# Patient Record
Sex: Female | Born: 1985
Health system: Southern US, Community
[De-identification: ages and names within clinical notes are randomized; demographics above are authoritative.]

## PROBLEM LIST (undated history)

## (undated) DIAGNOSIS — Z8041 Family history of malignant neoplasm of ovary: Secondary | ICD-10-CM

## (undated) DIAGNOSIS — R102 Pelvic and perineal pain: Secondary | ICD-10-CM

## (undated) DIAGNOSIS — Z800 Family history of malignant neoplasm of digestive organs: Secondary | ICD-10-CM

## (undated) DIAGNOSIS — Z803 Family history of malignant neoplasm of breast: Secondary | ICD-10-CM

## (undated) HISTORY — DX: Pelvic and perineal pain: R10.2

## (undated) HISTORY — PX: CHOLECYSTECTOMY: SHX55

## (undated) HISTORY — DX: Family history of malignant neoplasm of breast: Z80.3

## (undated) HISTORY — DX: Family history of malignant neoplasm of digestive organs: Z80.0

## (undated) HISTORY — DX: Family history of malignant neoplasm of ovary: Z80.41

---
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
## 2005-04-27 ENCOUNTER — Ambulatory Visit: Payer: Self-pay | Admitting: Family Medicine

## 2005-04-29 ENCOUNTER — Ambulatory Visit: Payer: Self-pay | Admitting: Family Medicine

## 2005-06-10 ENCOUNTER — Ambulatory Visit: Payer: Self-pay | Admitting: Family Medicine

## 2005-06-14 ENCOUNTER — Ambulatory Visit: Payer: Self-pay | Admitting: Family Medicine

## 2005-12-26 ENCOUNTER — Ambulatory Visit: Payer: Self-pay | Admitting: Family Medicine

## 2005-12-27 ENCOUNTER — Ambulatory Visit: Payer: Self-pay | Admitting: Family Medicine

## 2006-02-06 ENCOUNTER — Ambulatory Visit: Payer: Self-pay | Admitting: Family Medicine

## 2006-07-05 ENCOUNTER — Ambulatory Visit (HOSPITAL_BASED_OUTPATIENT_CLINIC_OR_DEPARTMENT_OTHER): Admission: RE | Admit: 2006-07-05 | Discharge: 2006-07-05 | Payer: Self-pay | Admitting: Plastic Surgery

## 2006-07-05 ENCOUNTER — Encounter (INDEPENDENT_AMBULATORY_CARE_PROVIDER_SITE_OTHER): Payer: Self-pay | Admitting: *Deleted

## 2007-04-26 ENCOUNTER — Ambulatory Visit: Payer: Self-pay | Admitting: Family Medicine

## 2007-05-02 ENCOUNTER — Ambulatory Visit: Payer: Self-pay | Admitting: General Surgery

## 2007-05-08 ENCOUNTER — Encounter (INDEPENDENT_AMBULATORY_CARE_PROVIDER_SITE_OTHER): Payer: Self-pay | Admitting: Internal Medicine

## 2007-05-08 ENCOUNTER — Ambulatory Visit: Payer: Self-pay | Admitting: General Surgery

## 2007-07-30 ENCOUNTER — Ambulatory Visit: Payer: Self-pay | Admitting: Family Medicine

## 2007-07-30 DIAGNOSIS — K7689 Other specified diseases of liver: Secondary | ICD-10-CM

## 2007-07-31 LAB — CONVERTED CEMR LAB
ALT: 17 units/L (ref 0–35)
AST: 17 units/L (ref 0–37)
Albumin: 4.1 g/dL (ref 3.5–5.2)
Alkaline Phosphatase: 50 units/L (ref 39–117)
BUN: 10 mg/dL (ref 6–23)
Basophils Absolute: 0 10*3/uL (ref 0.0–0.1)
Basophils Relative: 0.4 % (ref 0.0–1.0)
CO2: 30 meq/L (ref 19–32)
Calcium: 9.4 mg/dL (ref 8.4–10.5)
Chloride: 109 meq/L (ref 96–112)
Creatinine, Ser: 1 mg/dL (ref 0.4–1.2)
HDL: 52 mg/dL (ref >39.0)
Hemoglobin: 13.6 g/dL (ref 12.0–15.0)
LDL Cholesterol: 79 mg/dL (ref 0–99)
MCHC: 34.7 g/dL (ref 30.0–36.0)
Monocytes Absolute: 0.5 10*3/uL (ref 0.2–0.7)
Monocytes Relative: 6.5 % (ref 3.0–11.0)
Potassium: 4.5 meq/L (ref 3.5–5.1)
RBC: 4.43 M/uL (ref 3.87–5.11)
RDW: 12.2 % (ref 11.5–14.6)
Total Bilirubin: 0.7 mg/dL (ref 0.3–1.2)
Total CHOL/HDL Ratio: 2.9
Total Protein: 6.8 g/dL (ref 6.0–8.3)
VLDL: 21 mg/dL (ref 0–40)

## 2007-09-07 ENCOUNTER — Ambulatory Visit: Payer: Self-pay | Admitting: Family Medicine

## 2007-09-07 ENCOUNTER — Telehealth (INDEPENDENT_AMBULATORY_CARE_PROVIDER_SITE_OTHER): Payer: Self-pay | Admitting: *Deleted

## 2007-09-07 DIAGNOSIS — R82998 Other abnormal findings in urine: Secondary | ICD-10-CM

## 2007-09-07 LAB — CONVERTED CEMR LAB
Bilirubin Urine: NEGATIVE
Glucose, Urine, Semiquant: NEGATIVE
Nitrite: NEGATIVE
Protein, U semiquant: 30
Specific Gravity, Urine: >=1.03
pH: 6

## 2007-09-08 ENCOUNTER — Encounter: Payer: Self-pay | Admitting: Family Medicine

## 2007-10-08 ENCOUNTER — Ambulatory Visit: Payer: Self-pay | Admitting: Family Medicine

## 2007-11-26 ENCOUNTER — Ambulatory Visit: Payer: Self-pay | Admitting: Internal Medicine

## 2008-02-15 ENCOUNTER — Telehealth: Payer: Self-pay | Admitting: Family Medicine

## 2008-03-11 ENCOUNTER — Ambulatory Visit: Payer: Self-pay | Admitting: Internal Medicine

## 2010-06-11 ENCOUNTER — Ambulatory Visit: Payer: Self-pay | Admitting: Obstetrics & Gynecology

## 2010-06-18 ENCOUNTER — Ambulatory Visit: Payer: Self-pay | Admitting: Obstetrics & Gynecology

## 2010-06-18 ENCOUNTER — Inpatient Hospital Stay (HOSPITAL_COMMUNITY): Admission: AD | Admit: 2010-06-18 | Discharge: 2010-06-21 | Payer: Self-pay | Admitting: Obstetrics and Gynecology

## 2010-06-25 ENCOUNTER — Ambulatory Visit: Payer: Self-pay | Admitting: Obstetrics & Gynecology

## 2010-07-02 ENCOUNTER — Ambulatory Visit: Payer: Self-pay | Admitting: Obstetrics & Gynecology

## 2010-07-06 ENCOUNTER — Inpatient Hospital Stay (HOSPITAL_COMMUNITY): Admission: AD | Admit: 2010-07-06 | Discharge: 2010-07-06 | Payer: Self-pay | Admitting: Obstetrics and Gynecology

## 2010-07-09 ENCOUNTER — Encounter (HOSPITAL_COMMUNITY): Payer: Self-pay | Admitting: Obstetrics and Gynecology

## 2010-07-09 ENCOUNTER — Inpatient Hospital Stay (HOSPITAL_COMMUNITY): Admission: AD | Admit: 2010-07-09 | Discharge: 2010-07-11 | Payer: Self-pay | Admitting: Obstetrics and Gynecology

## 2010-07-11 ENCOUNTER — Encounter: Admission: RE | Admit: 2010-07-11 | Discharge: 2010-07-12 | Payer: Self-pay | Admitting: Obstetrics and Gynecology

## 2010-12-30 LAB — CBC
HCT: 37.7 % (ref 36.0–46.0)
Hemoglobin: 9.5 g/dL — ABNORMAL LOW (ref 12.0–15.0)
MCH: 30.4 pg (ref 26.0–34.0)
MCH: 31.4 pg (ref 26.0–34.0)
MCHC: 35.3 g/dL (ref 30.0–36.0)
MCV: 88.9 fL (ref 78.0–100.0)
MCV: 89.2 fL (ref 78.0–100.0)
Platelets: 124 10*3/uL — ABNORMAL LOW (ref 150–400)
Platelets: 155 10*3/uL (ref 150–400)
RBC: 3.02 MIL/uL — ABNORMAL LOW (ref 3.87–5.11)
RDW: 14.6 % (ref 11.5–15.5)
WBC: 11.3 10*3/uL — ABNORMAL HIGH (ref 4.0–10.5)

## 2011-03-04 NOTE — Op Note (Signed)
Natalie Walters, FRIESENHAHN            ACCOUNT NO.:  [id number]   MEDICAL RECORD NO.:  [id number]          PATIENT TYPE:  AMB   LOCATION:  DSC                          FACILITY:  [facility]   PHYSICIAN:  Alfredia Ferguson, M.D.  DATE OF BIRTH:  September 12, 1986   DATE OF PROCEDURE:  DATE OF DISCHARGE:                                 OPERATIVE REPORT   PREOPERATIVE DIAGNOSIS:  6-cm lipoma, left upper back and paraspinal area.   POSTOPERATIVE DIAGNOSIS:  6-cm lipoma, left upper back and paraspinal area.   OPERATION PERFORMED:  Excision of lipoma, left upper back.   SURGEON:  Dr. Benna Dunks.   ANESTHESIA:  2% Xylocaine, 1:100,000 epinephrine.   INDICATION FOR SURGERY:  This is a 25 year old female with a slowly  enlarging soft tissue mass in the left upper back.  The patient wishes to  have this area removed.  She believes that it has progressively been getting  larger.  She understands the risk of recurrence of the lipoma.  She  understands that she will have permanent, potentially unsightly, scar on the  back.  In spite of that, the patient wishes to proceed with the surgery.   DESCRIPTION OF SURGERY:  The patient was placed in a prone position, and  skin marks were placed overlying the lipoma.  Local anesthesia was  infiltrated, and the left upper back was prepped and draped with sterile  fashion.  An incision was made approximately 3-4 cm in length directly over  the lipoma.  The incision was deepened until reaching the capsule overlying  the lipoma.  This capsule was opened, and the lipoma was visualized.  The  lipoma was dissected out of its anatomic bed using blunt and electrocautery  dissection.  It was lying on top of the fascia of one of the paraspinous  muscles.  The lipoma was removed in its entirety without difficulty.  The  specimen was submitted for pathology.  Hemostasis was accomplished using  pressure and electrocautery.  The wound was closed by approximating the deep  fascia using  interrupted 3-0 Monocryl suture.  The dermis was closed in a  similar fashion.  A running subcuticular 3-0 Monocryl was placed.  Estimated  blood loss was minimal.  The area was cleansed and dried, and Steri-Strips  were applied followed by a dressing.  The patient was discharged to home in  the care of her parents.      Alfredia Ferguson, M.D.  Electronically Signed     WBB/MEDQ  D:  07/05/2006  T:  07/05/2006  Job:  [id number]

## 2014-08-20 ENCOUNTER — Other Ambulatory Visit: Payer: Self-pay | Admitting: Obstetrics and Gynecology

## 2014-08-21 LAB — CYTOLOGY - PAP

## 2015-03-29 ENCOUNTER — Encounter: Payer: Self-pay | Admitting: *Deleted

## 2015-03-30 ENCOUNTER — Ambulatory Visit (INDEPENDENT_AMBULATORY_CARE_PROVIDER_SITE_OTHER): Payer: 59 | Admitting: *Deleted

## 2015-03-30 VITALS — BP 125/70 | HR 69 | Ht 70.0 in | Wt 200.9 lb

## 2015-03-30 DIAGNOSIS — E669 Obesity, unspecified: Secondary | ICD-10-CM

## 2015-03-30 MED ORDER — CYANOCOBALAMIN 1000 MCG/ML IJ SOLN
1000.0000 ug | Freq: Once | INTRAMUSCULAR | Status: AC
  Administered 2015-03-30: 1000 ug via INTRAMUSCULAR

## 2015-04-24 ENCOUNTER — Institutional Professional Consult (permissible substitution): Payer: Self-pay | Admitting: Obstetrics and Gynecology

## 2015-05-01 ENCOUNTER — Other Ambulatory Visit: Payer: 59

## 2015-05-01 ENCOUNTER — Other Ambulatory Visit: Payer: Self-pay

## 2015-05-01 ENCOUNTER — Ambulatory Visit (INDEPENDENT_AMBULATORY_CARE_PROVIDER_SITE_OTHER): Payer: 59 | Admitting: Obstetrics and Gynecology

## 2015-05-01 ENCOUNTER — Other Ambulatory Visit: Payer: Self-pay | Admitting: Obstetrics and Gynecology

## 2015-05-01 ENCOUNTER — Encounter: Payer: Self-pay | Admitting: Obstetrics and Gynecology

## 2015-05-01 VITALS — BP 109/73 | HR 65 | Ht 70.0 in | Wt 196.2 lb

## 2015-05-01 DIAGNOSIS — N8320 Unspecified ovarian cysts: Secondary | ICD-10-CM | POA: Diagnosis not present

## 2015-05-01 DIAGNOSIS — R102 Pelvic and perineal pain unspecified side: Secondary | ICD-10-CM

## 2015-05-01 DIAGNOSIS — N83209 Unspecified ovarian cyst, unspecified side: Secondary | ICD-10-CM

## 2015-05-01 DIAGNOSIS — E663 Overweight: Secondary | ICD-10-CM | POA: Diagnosis not present

## 2015-05-01 MED ORDER — CYANOCOBALAMIN 1000 MCG/ML IJ SOLN
1000.0000 ug | Freq: Once | INTRAMUSCULAR | Status: AC
  Administered 2015-05-01: 1000 ug via INTRAMUSCULAR

## 2015-05-01 NOTE — Progress Notes (Signed)
Patient ID: Erby PianStephanie M Walters, female   DOB: 07/31/1986, 29 y.o.   MRN: [id number]  Here for f/u scan for previous ovarian cyst:  Indications:History of rt ovarian cyst Findings:  The uterus measures 8.1 x 5.0 x 3.7 cm.. Echo texture is homogenous without evidence of focal masses.  The Endometrium measures 1.8 mm. IUD seen in normal location within endometrium.  Right Ovary measures 5.3 x 2.1 x 2.0  cm. It is normal in appearance. Left Ovary measures 3.2 x 1.8 x 2.4  cm. It is normal appearance. Survey of the adnexa demonstrates no adnexal masses. There is no free fluid in the cul de sac.  Impression: 1. Normal appearing pelvic ultrasound. No evidence of ovarian cyst on today's exam. IUD appears in normal location within endometrium.    Reviewed findings with patient; Mirena due to be replaced in Oct- will schedule appointment.  Irean Kendricks Burr,CNM

## 2015-05-15 ENCOUNTER — Encounter: Payer: Self-pay | Admitting: Emergency Medicine

## 2015-05-15 ENCOUNTER — Ambulatory Visit
Admission: EM | Admit: 2015-05-15 | Discharge: 2015-05-15 | Disposition: A | Discharge status: Home / Self Care | Payer: 59 | Attending: Internal Medicine | Admitting: Internal Medicine

## 2015-05-15 DIAGNOSIS — Z79899 Other long term (current) drug therapy: Secondary | ICD-10-CM | POA: Diagnosis not present

## 2015-05-15 DIAGNOSIS — R319 Hematuria, unspecified: Secondary | ICD-10-CM | POA: Insufficient documentation

## 2015-05-15 DIAGNOSIS — N390 Urinary tract infection, site not specified: Secondary | ICD-10-CM | POA: Diagnosis present

## 2015-05-15 DIAGNOSIS — R300 Dysuria: Secondary | ICD-10-CM | POA: Diagnosis not present

## 2015-05-15 LAB — URINALYSIS COMPLETE WITH MICROSCOPIC (ARMC ONLY)
Bacteria, UA: NONE SEEN — AB
Glucose, UA: NEGATIVE mg/dL
Nitrite: NEGATIVE
PH: 6 (ref 5.0–8.0)
Protein, ur: >300 mg/dL — AB
SPECIFIC GRAVITY, URINE: 1.02 (ref 1.005–1.030)

## 2015-05-15 MED ORDER — PHENAZOPYRIDINE HCL 200 MG PO TABS: 200.0000 mg | ORAL_TABLET | Freq: Three times a day (TID) | ORAL | Status: DC

## 2015-05-15 MED ORDER — NITROFURANTOIN MONOHYD MACRO 100 MG PO CAPS: 100.0000 mg | ORAL_CAPSULE | Freq: Two times a day (BID) | ORAL | Status: DC

## 2015-05-15 NOTE — Discharge Instructions (Signed)
Hematuria Hematuria is blood in your urine. It can be caused by a bladder infection, kidney infection, prostate infection, kidney stone, or cancer of your urinary tract. Infections can usually be treated with medicine, and a kidney stone usually will pass through your urine. If neither of these is the cause of your hematuria, further workup to find out the reason may be needed. It is very important that you tell your health care provider about any blood you see in your urine, even if the blood stops without treatment or happens without causing pain. Blood in your urine that happens and then stops and then happens again can be a symptom of a very serious condition. Also, pain is not a symptom in the initial stages of many urinary cancers. HOME CARE INSTRUCTIONS   Drink lots of fluid, 3-4 quarts a day. If you have been diagnosed with an infection, cranberry juice is especially recommended, in addition to large amounts of water.  Avoid caffeine, tea, and carbonated beverages because they tend to irritate the bladder.  Avoid alcohol because it may irritate the prostate.  Take all medicines as directed by your health care provider.  If you were prescribed an antibiotic medicine, finish it all even if you start to feel better.  If you have been diagnosed with a kidney stone, follow your health care provider's instructions regarding straining your urine to catch the stone.  Empty your bladder often. Avoid holding urine for long periods of time.  After a bowel movement, women should cleanse front to back. Use each tissue only once.  Empty your bladder before and after sexual intercourse if you are a female. SEEK MEDICAL CARE IF:  You develop back pain.  You have a fever.  You have a feeling of sickness in your stomach (nausea) or vomiting.  Your symptoms are not better in 3 days. Return sooner if you are getting worse. SEEK IMMEDIATE MEDICAL CARE IF:   You develop severe vomiting and are  unable to keep the medicine down.  You develop severe back or abdominal pain despite taking your medicines.  You begin passing a large amount of blood or clots in your urine.  You feel extremely weak or faint, or you pass out. MAKE SURE YOU:   Understand these instructions.  Will watch your condition.  Will get help right away if you are not doing well or get worse. Document Released: 10/03/2005 Document Revised: 02/17/2014 Document Reviewed: 06/03/2013 [facility] Patient Information 2015 [place], [place]. This information is not intended to replace advice given to you by your health care provider. Make sure you discuss any questions you have with your health care provider. Kidney Stones Kidney stones (urolithiasis) are deposits that form inside your kidneys. The intense pain is caused by the stone moving through the urinary tract. When the stone moves, the ureter goes into spasm around the stone. The stone is usually passed in the urine.  CAUSES   A disorder that makes certain neck glands produce too much parathyroid hormone (primary hyperparathyroidism).  A buildup of uric acid crystals, similar to gout in your joints.  Narrowing (stricture) of the ureter.  A kidney obstruction present at birth (congenital obstruction).  Previous surgery on the kidney or ureters.  Numerous kidney infections. SYMPTOMS   Feeling sick to your stomach (nauseous).  Throwing up (vomiting).  Blood in the urine (hematuria).  Pain that usually spreads (radiates) to the groin.  Frequency or urgency of urination. DIAGNOSIS   Taking a history and physical  exam.  Blood or urine tests.  CT scan.  Occasionally, an examination of the inside of the urinary bladder (cystoscopy) is performed. TREATMENT   Observation.  Increasing your fluid intake.  Extracorporeal shock wave lithotripsy--This is a noninvasive procedure that uses shock waves to break up kidney stones.  Surgery may be needed if  you have severe pain or persistent obstruction. There are various surgical procedures. Most of the procedures are performed with the use of small instruments. Only small incisions are needed to accommodate these instruments, so recovery time is minimized. The size, location, and chemical composition are all important variables that will determine the proper choice of action for you. Talk to your health care provider to better understand your situation so that you will minimize the risk of injury to yourself and your kidney.  HOME CARE INSTRUCTIONS   Drink enough water and fluids to keep your urine clear or pale yellow. This will help you to pass the stone or stone fragments.  Strain all urine through the provided strainer. Keep all particulate matter and stones for your health care provider to see. The stone causing the pain may be as small as a grain of salt. It is very important to use the strainer each and every time you pass your urine. The collection of your stone will allow your health care provider to analyze it and verify that a stone has actually passed. The stone analysis will often identify what you can do to reduce the incidence of recurrences.  Only take over-the-counter or prescription medicines for pain, discomfort, or fever as directed by your health care provider.  Make a follow-up appointment with your health care provider as directed.  Get follow-up X-rays if required. The absence of pain does not always mean that the stone has passed. It may have only stopped moving. If the urine remains completely obstructed, it can cause loss of kidney function or even complete destruction of the kidney. It is your responsibility to make sure X-rays and follow-ups are completed. Ultrasounds of the kidney can show blockages and the status of the kidney. Ultrasounds are not associated with any radiation and can be performed easily in a matter of minutes. SEEK MEDICAL CARE IF:  You experience pain  that is progressive and unresponsive to any pain medicine you have been prescribed. SEEK IMMEDIATE MEDICAL CARE IF:   Pain cannot be controlled with the prescribed medicine.  You have a fever or shaking chills.  The severity or intensity of pain increases over 18 hours and is not relieved by pain medicine.  You develop a new onset of abdominal pain.  You feel faint or pass out.  You are unable to urinate. MAKE SURE YOU:   Understand these instructions.  Will watch your condition.  Will get help right away if you are not doing well or get worse. Document Released: 10/03/2005 Document Revised: 06/05/2013 Document Reviewed: 03/06/2013 [facility] Patient Information 2015 [place], [place]. This information is not intended to replace advice given to you by your health care provider. Make sure you discuss any questions you have with your health care provider. Dietary Guidelines to Help Prevent Kidney Stones Your risk of kidney stones can be decreased by adjusting the foods you eat. The most important thing you can do is drink enough fluid. You should drink enough fluid to keep your urine clear or pale yellow. The following guidelines provide specific information for the type of kidney stone you have had. GUIDELINES ACCORDING TO TYPE OF KIDNEY  STONE Calcium Oxalate Kidney Stones  Reduce the amount of salt you eat. Foods that have a lot of salt cause your body to release excess calcium into your urine. The excess calcium can combine with a substance called oxalate to form kidney stones.  Reduce the amount of animal protein you eat if the amount you eat is excessive. Animal protein causes your body to release excess calcium into your urine. Ask your dietitian how much protein from animal sources you should be eating.  Avoid foods that are high in oxalates. If you take vitamins, they should have less than 500 mg of vitamin C. Your body turns vitamin C into oxalates. You do not need to avoid fruits  and vegetables high in vitamin C. Calcium Phosphate Kidney Stones  Reduce the amount of salt you eat to help prevent the release of excess calcium into your urine.  Reduce the amount of animal protein you eat if the amount you eat is excessive. Animal protein causes your body to release excess calcium into your urine. Ask your dietitian how much protein from animal sources you should be eating.  Get enough calcium from food or take a calcium supplement (ask your dietitian for recommendations). Food sources of calcium that do not increase your risk of kidney stones include:  Broccoli.  Dairy products, such as cheese and yogurt.  Pudding. Uric Acid Kidney Stones  Do not have more than 6 oz of animal protein per day. FOOD SOURCES Animal Protein Sources  Meat (all types).  Poultry.  Eggs.  Fish, seafood. Foods High in [employer] seasonings.  Soy sauce.  Teriyaki sauce.  Cured and processed meats.  Salted crackers and snack foods.  Fast food.  Canned soups and most canned foods. Foods High in Oxalates  Grains:  Amaranth.  Barley.  Grits.  Wheat germ.  Bran.  Buckwheat flour.  All bran cereals.  Pretzels.  Whole wheat bread.  Vegetables:  Beans (wax).  Beets and beet greens.  Collard greens.  Eggplant.  [place]ole.  Leeks.  Okra.  Parsley.  Rutabagas.  Spinach.  Swiss chard.  Tomato paste.  Fried potatoes.  Sweet potatoes.  Fruits:  Red currants.  Figs.  Kiwi.  Rhubarb.  Meat and Other Protein Sources:  Beans (dried).  Soy burgers and other soybean products.  Miso.  Nuts (peanuts, almonds, pecans, cashews, hazelnuts).  Nut butters.  Sesame seeds and tahini (paste made of sesame seeds).  Poppy seeds.  Beverages:  Chocolate drink mixes.  Soy milk.  Instant iced tea.  Juices made from high-oxalate fruits or vegetables.  Other:  Carob.  Chocolate.  Fruitcake.  Marmalades. Document Released:  01/28/2011 Document Revised: 10/08/2013 Document Reviewed: 08/30/2013 [facility] Patient Information 2015 [place], [place]. This information is not intended to replace advice given to you by your health care provider. Make sure you discuss any questions you have with your health care provider. Dysuria Dysuria is the medical term for pain with urination. There are many causes for dysuria, but urinary tract infection is the most common. If a urinalysis was performed it can show that there is a urinary tract infection. A urine culture confirms that you or your child is sick. You will need to follow up with a healthcare provider because:  If a urine culture was done you will need to know the culture results and treatment recommendations.  If the urine culture was positive, you or your child will need to be put on antibiotics or know if the antibiotics  prescribed are the right antibiotics for your urinary tract infection.  If the urine culture is negative (no urinary tract infection), then other causes may need to be explored or antibiotics need to be stopped. Today laboratory work may have been done and there does not seem to be an infection. If cultures were done they will take at least 24 to 48 hours to be completed. Today x-rays may have been taken and they read as normal. No cause can be found for the problems. The x-rays may be re-read by a radiologist and you will be contacted if additional findings are made. You or your child may have been put on medications to help with this problem until you can see your primary caregiver. If the problems get better, see your primary caregiver if the problems return. If you were given antibiotics (medications which kill germs), take all of the mediations as directed for the full course of treatment.  If laboratory work was done, you need to find the results. Leave a telephone number where you can be reached. If this is not possible, make sure you find out how you are  to get test results. HOME CARE INSTRUCTIONS   Drink lots of fluids. For adults, drink eight, 8 ounce glasses of clear juice or water a day. For children, replace fluids as suggested by your caregiver.  Empty the bladder often. Avoid holding urine for long periods of time.  After a bowel movement, women should cleanse front to back, using each tissue only once.  Empty your bladder before and after sexual intercourse.  Take all the medicine given to you until it is gone. You may feel better in a few days, but TAKE ALL MEDICINE.  Avoid caffeine, tea, alcohol and carbonated beverages, because they tend to irritate the bladder.  In men, alcohol may irritate the prostate.  Only take over-the-counter or prescription medicines for pain, discomfort, or fever as directed by your caregiver.  If your caregiver has given you a follow-up appointment, it is very important to keep that appointment. Not keeping the appointment could result in a chronic or permanent injury, pain, and disability. If there is any problem keeping the appointment, you must call back to this facility for assistance. SEEK IMMEDIATE MEDICAL CARE IF:   Back pain develops.  A fever develops.  There is nausea (feeling sick to your stomach) or vomiting (throwing up).  Problems are no better with medications or are getting worse. MAKE SURE YOU:   Understand these instructions.  Will watch your condition.  Will get help right away if you are not doing well or get worse. Document Released: 07/01/2004 Document Revised: 12/26/2011 Document Reviewed: 05/08/2008 [facility] Patient Information 2015 [place], [place]. This information is not intended to replace advice given to you by your health care provider. Make sure you discuss any questions you have with your health care provider. Urinary Tract Infection Urinary tract infections (UTIs) can develop anywhere along your urinary tract. Your urinary tract is your body's drainage system  for removing wastes and extra water. Your urinary tract includes two kidneys, two ureters, a bladder, and a urethra. Your kidneys are a pair of bean-shaped organs. Each kidney is about the size of your fist. They are located below your ribs, one on each side of your spine. CAUSES Infections are caused by microbes, which are microscopic organisms, including fungi, viruses, and bacteria. These organisms are so small that they can only be seen through a microscope. Bacteria are the microbes that  most commonly cause UTIs. SYMPTOMS  Symptoms of UTIs may vary by age and gender of the patient and by the location of the infection. Symptoms in young women typically include a frequent and intense urge to urinate and a painful, burning feeling in the bladder or urethra during urination. Older women and men are more likely to be tired, shaky, and weak and have muscle aches and abdominal pain. A fever may mean the infection is in your kidneys. Other symptoms of a kidney infection include pain in your back or sides below the ribs, nausea, and vomiting. DIAGNOSIS To diagnose a UTI, your caregiver will ask you about your symptoms. Your caregiver also will ask to provide a urine sample. The urine sample will be tested for bacteria and white blood cells. White blood cells are made by your body to help fight infection. TREATMENT  Typically, UTIs can be treated with medication. Because most UTIs are caused by a bacterial infection, they usually can be treated with the use of antibiotics. The choice of antibiotic and length of treatment depend on your symptoms and the type of bacteria causing your infection. HOME CARE INSTRUCTIONS  If you were prescribed antibiotics, take them exactly as your caregiver instructs you. Finish the medication even if you feel better after you have only taken some of the medication.  Drink enough water and fluids to keep your urine clear or pale yellow.  Avoid caffeine, tea, and carbonated  beverages. They tend to irritate your bladder.  Empty your bladder often. Avoid holding urine for long periods of time.  Empty your bladder before and after sexual intercourse.  After a bowel movement, women should cleanse from front to back. Use each tissue only once. SEEK MEDICAL CARE IF:   You have back pain.  You develop a fever.  Your symptoms do not begin to resolve within 3 days. SEEK IMMEDIATE MEDICAL CARE IF:   You have severe back pain or lower abdominal pain.  You develop chills.  You have nausea or vomiting.  You have continued burning or discomfort with urination. MAKE SURE YOU:   Understand these instructions.  Will watch your condition.  Will get help right away if you are not doing well or get worse. Document Released: 07/13/2005 Document Revised: 04/03/2012 Document Reviewed: 11/11/2011 [facility] Patient Information 2015 [place], [place]. This information is not intended to replace advice given to you by your health care provider. Make sure you discuss any questions you have with your health care provider.

## 2015-05-15 NOTE — ED Notes (Signed)
Pt states that she woke up this morning with urinary frequency and blood in her urine.

## 2015-05-15 NOTE — ED Provider Notes (Signed)
CSN: [id number]     Arrival date & time 05/15/15  0810 History   First MD Initiated Contact with Patient 05/15/15 [phone]     Chief Complaint  Patient presents with  . Urinary Tract Infection  . Hematuria   (Consider location/radiation/quality/duration/timing/severity/associated sxs/prior Treatment) HPI Comments: Married caucasian female here for evaluation of urinary urgency, frequency, hematuria, right low back pain that radiates to right side with sudden onset after awakening this am preparing to get ready for work.  Had to stop during drive from house to [facility] to pee (12 minutes duration drive per patient)  Last sexually active 3 days ago.  Last UTI 2011 and 2008.  Has taken cipro and pyridium.  Denied fever, chills, nausea, vomiting, headache, diarrhea, abdomen pain, rash.  Mother with history kidney stones (1 episode) age 37.  Recently working out of a different location for work in [employer] this week.  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  Followed up with GYN recently for ovarian cyst most recent US negative was positive May 2016.  Was started on nuvaring in addition to mirena to help prevent ovarian cysts.  Has been on phentermine x 2 months but only take 1 pill every 5 days as she doesn't like how it makes her feel last dose Sunday 24 Jul.  Did not eat breakfast this morning as trying to get to work on time and have appt with [place] this am and pick up medications at pharmacy.  Patient is a 29 y.o. female presenting with urinary tract infection and hematuria. The history is provided by the patient.  Urinary Tract Infection Pain quality:  Burning Pain severity:  Moderate Onset quality:  Sudden Duration:  2 hours Timing:  Intermittent Progression:  Unchanged Chronicity:  New Recent urinary tract infections: no   Relieved by:  Nothing Urinary symptoms: frequent urination and hematuria   Urinary symptoms: no discolored urine, no foul-smelling urine, no hesitancy and no bladder incontinence    Associated symptoms: no abdominal pain, no fever, no flank pain, no genital lesions, no nausea, no vaginal discharge and no vomiting   Risk factors: recurrent urinary tract infections and sexually active   Risk factors: no hx of pyelonephritis, no hx of urolithiasis, no kidney transplant, not pregnant, no renal cysts, no renal disease, not single kidney, no sexually transmitted infections and no urinary catheter   Hematuria Pertinent negatives include no chest pain, no abdominal pain, no headaches and no shortness of breath.    Past Medical History  Diagnosis Date  . Pelvic pain in female   . Family history of ovarian cancer   . Family history of colon cancer   . Family history of breast cancer    Past Surgical History  Procedure Laterality Date  . Cesarean section  2011  . Gallbladder surgery  2006   Family History  Problem Relation Age of Onset  . Cancer Mother     breast  . Cancer Sister     breast  . Cancer Maternal Grandmother     ovarian, breast  . Heart disease Paternal Grandfather    History  Substance Use Topics  . Smoking status: Never Smoker   . Smokeless tobacco: Never Used  . Alcohol Use: Yes     Comment: occas   OB History    Gravida Para Term Preterm AB TAB SAB Ectopic Multiple Living   1 1 1      1 2      Review of Systems  Constitutional: Negative.  Negative for fever, chills, diaphoresis, activity change, appetite change and fatigue.  HENT: Negative for congestion, dental problem, drooling, ear discharge, ear pain, facial swelling, hearing loss, mouth sores, nosebleeds, rhinorrhea, sore throat, trouble swallowing and voice change.   Eyes: Negative for photophobia, pain, discharge, redness, itching and visual disturbance.  Respiratory: Negative for cough, shortness of breath and wheezing.   Cardiovascular: Negative for chest pain and leg swelling.  Gastrointestinal: Negative for nausea, vomiting, abdominal pain, diarrhea, constipation, blood in stool  and abdominal distention.  Endocrine: Negative for cold intolerance and heat intolerance.  Genitourinary: Positive for dysuria, urgency, frequency, hematuria and decreased urine volume. Negative for flank pain, vaginal bleeding, vaginal discharge, enuresis, difficulty urinating, genital sores, vaginal pain, menstrual problem, pelvic pain and dyspareunia.  Musculoskeletal: Positive for back pain. Negative for myalgias, joint swelling, arthralgias, gait problem, neck pain and neck stiffness.  Skin: Negative for color change, pallor, rash and wound.  Allergic/Immunologic: Negative for environmental allergies and food allergies.  Neurological: Negative for dizziness, tremors, seizures, syncope, facial asymmetry, speech difficulty, weakness, light-headedness, numbness and headaches.  Hematological: Negative for adenopathy. Does not bruise/bleed easily.  Psychiatric/Behavioral: Negative for behavioral problems, confusion, sleep disturbance, decreased concentration and agitation. The patient is not nervous/anxious and is not hyperactive.     Allergies  Sulfa antibiotics  Home Medications   Prior to Admission medications   Medication Sig Start Date End Date Taking? Authorizing Provider  etonogestrel-ethinyl estradiol (NUVARING) 0.12-0.015 MG/24HR vaginal ring Place 1 each vaginally every 28 (twenty-eight) days. Insert vaginally and leave in place for 3 consecutive weeks, then remove for 1 week.    Historical Provider, MD  nitrofurantoin, macrocrystal-monohydrate, (MACROBID) 100 MG capsule Take 1 capsule (100 mg total) by mouth 2 (two) times daily. 05/15/15   Barbaraann Barthel, NP  phenazopyridine (PYRIDIUM) 200 MG tablet Take 1 tablet (200 mg total) by mouth 3 (three) times daily. 05/15/15   Barbaraann Barthel, NP  phentermine (ADIPEX-P) 37.5 MG tablet Take 37.5 mg by mouth daily before breakfast.    Historical Provider, MD  vitamin B-12 (CYANOCOBALAMIN) 1000 MCG tablet Inject 1,000 mcg into the muscle  every 30 (thirty) days.    Historical Provider, MD   BP 120/70 mmHg  Pulse 68  Temp(Src) 98.2 F (36.8 C) (Oral)  Resp 16  SpO2 100% Physical Exam  Constitutional: She is oriented to person, place, and time. Vital signs are normal. She appears well-developed and well-nourished. No distress.  HENT:  Head: Normocephalic and atraumatic.  Right Ear: External ear normal.  Left Ear: External ear normal.  Nose: Nose normal.  Mouth/Throat: Oropharynx is clear and moist. No oropharyngeal exudate.  Eyes: Conjunctivae, EOM and lids are normal. Pupils are equal, round, and reactive to light. Right eye exhibits no discharge. Left eye exhibits no discharge. No scleral icterus.  Neck: Trachea normal and normal range of motion. Neck supple. No tracheal deviation present.  Cardiovascular: Normal rate, regular rhythm, normal heart sounds and intact distal pulses.  Exam reveals no gallop and no friction rub.   No murmur heard. Pulmonary/Chest: Effort normal and breath sounds normal. No stridor. No respiratory distress. She has no wheezes. She has no rales. She exhibits no tenderness.  Abdominal: Soft. She exhibits no shifting dullness, no distension, no pulsatile liver, no fluid wave, no abdominal bruit, no ascites, no pulsatile midline mass and no mass. Bowel sounds are decreased. There is no hepatosplenomegaly. There is no tenderness. There is no rigidity, no rebound, no guarding, no CVA tenderness, no  tenderness at McBurney's point and negative Murphy's sign. Hernia confirmed negative in the ventral area.  Dull to percussion x 4 quads  Musculoskeletal: Normal range of motion. She exhibits no edema or tenderness.  Neurological: She is alert and oriented to person, place, and time. She exhibits normal muscle tone. Coordination normal.  Skin: Skin is warm, dry and intact. No rash noted. She is not diaphoretic. No erythema. No pallor.  Psychiatric: She has a normal mood and affect. Her speech is normal and  behavior is normal. Judgment and thought content normal. Cognition and memory are normal.  Nursing note and vitals reviewed.   ED Course  Procedures (including critical care time) Labs Review Labs Reviewed  URINALYSIS COMPLETEWITH MICROSCOPIC [facility] ONLY)    Imaging Review No results found.  0915 urinated x 3 in clinic after drinking 1 1/2 liters water.  Less than 30cc sample.  Burning with urination clear.  A&Ox3 not TTP any quadrant abdomen. negative CVA tenderness bilaterally skin warm dry and pink.  MDM   1. Hematuria   2. Dysuria    Urine sample scant not enough to spin gross hematuria family history mom with kidney stones age 62.  Patient symptoms consistent with UTIs in the past not worse tolerating po fluids without difficulty will continue to push po fluids as heat wave/recent sexual activity/job site location change this week longer commute probable trigger for uti.  Did not want imaging, IV fluids at this time.  Will reconsider if worsening symptoms or no improvement with antibiotics her typical 1 dose and symptoms improving/resolve.  Has used cipro in the past.  Allergic sulfa.  Will start on macrobid  po BID x 7 days today.  Has IUD does not require back up contraception.  Medications as directed.  Patient is also to push fluids and may use Pyridium  po TID x 2 days as needed. Call or return to clinic as needed if these symptoms worsen or fail to improve as anticipated.  Exitcare handout on cystitis, kidney stones, dysuria, hematuria, kidney stone prevention given to patient Work excuse x 24 hours.  Patient verbalized agreement and understanding of treatment plan and had no further questions at this time. P2:  Hydrate and cranberry juice    Barbaraann Barthel, NP 05/15/15 [phone]

## 2015-06-08 ENCOUNTER — Ambulatory Visit
Admission: EM | Admit: 2015-06-08 | Discharge: 2015-06-08 | Disposition: A | Discharge status: Home / Self Care | Payer: 59 | Attending: Family Medicine | Admitting: Family Medicine

## 2015-06-08 ENCOUNTER — Encounter: Payer: Self-pay | Admitting: Emergency Medicine

## 2015-06-08 DIAGNOSIS — N390 Urinary tract infection, site not specified: Secondary | ICD-10-CM | POA: Diagnosis not present

## 2015-06-08 LAB — URINALYSIS COMPLETE WITH MICROSCOPIC (ARMC ONLY)
Bilirubin Urine: NEGATIVE
GLUCOSE, UA: NEGATIVE mg/dL
Ketones, ur: NEGATIVE mg/dL
Nitrite: POSITIVE — AB
SPECIFIC GRAVITY, URINE: 1.01 (ref 1.005–1.030)
pH: 6 (ref 5.0–8.0)

## 2015-06-08 MED ORDER — NITROFURANTOIN MONOHYD MACRO 100 MG PO CAPS: 100.0000 mg | ORAL_CAPSULE | Freq: Two times a day (BID) | ORAL | Status: DC

## 2015-06-08 MED ORDER — PHENAZOPYRIDINE HCL 200 MG PO TABS: 200.0000 mg | ORAL_TABLET | Freq: Three times a day (TID) | ORAL | Status: DC

## 2015-06-08 NOTE — Discharge Instructions (Signed)

## 2015-06-08 NOTE — ED Notes (Signed)
Patient c/o burning when urinating that started this morning. Patient denies fevers.

## 2015-06-08 NOTE — ED Provider Notes (Signed)
CSN: [id number]     Arrival date & time 06/08/15  0705 History   First MD Initiated Contact with Patient 06/08/15 0715     Chief Complaint  Patient presents with  . Dysuria   (Consider location/radiation/quality/duration/timing/severity/associated sxs/prior Treatment) HPI Comments: Married caucasian female here for evaluation of urinary urgency, frequency, low back pain, strong odor to urine woke her up 0400 today.  Took pyridium left over from 15 May 2015 UTI and helped with bladder spasms this am.  Stated macrobid resolved all symptoms in July.  Had light period last week first time since mirena put in.  Last UTI 2011 and 2008.  Has taken cipro, macrobid and pyridium in the past without side effects  Denied fever, chills, nausea, vomiting, headache, diarrhea, abdomen pain, rash.  Mother with history kidney stones (1 episode) age 80.   Followed up with GYN recently for ovarian cyst most recent US negative was positive May 2016.   Patient is a 29 y.o. female presenting with dysuria. The history is provided by the patient.  Dysuria Pain quality:  Burning Pain severity:  Moderate Onset quality:  Sudden Duration:  4 hours Timing:  Intermittent Progression:  Unchanged Chronicity:  Recurrent Recent urinary tract infections: yes   Relieved by:  Phenazopyridine Ineffective treatments:  Phenazopyridine Urinary symptoms: foul-smelling urine and frequent urination   Urinary symptoms: no discolored urine, no hematuria, no hesitancy and no bladder incontinence   Associated symptoms: no abdominal pain, no fever, no flank pain, no genital lesions, no nausea, no vaginal discharge and no vomiting   Risk factors: recurrent urinary tract infections and sexually active   Risk factors: no hx of pyelonephritis, no hx of urolithiasis, no kidney transplant, not pregnant, no renal cysts, no renal disease, not single kidney, no sexually transmitted infections and no urinary catheter     Past Medical History   Diagnosis Date  . Pelvic pain in female   . Family history of ovarian cancer   . Family history of colon cancer   . Family history of breast cancer    Past Surgical History  Procedure Laterality Date  . Cesarean section  2011  . Gallbladder surgery  2006  . Cholecystectomy     Family History  Problem Relation Age of Onset  . Cancer Mother     breast  . Cancer Sister     breast  . Cancer Maternal Grandmother     ovarian, breast  . Heart disease Paternal Grandfather    Social History  Substance Use Topics  . Smoking status: Never Smoker   . Smokeless tobacco: Never Used  . Alcohol Use: Yes     Comment: occas   OB History    Gravida Para Term Preterm AB TAB SAB Ectopic Multiple Living   Review of Systems  Constitutional: Negative for fever, chills, diaphoresis, activity change, appetite change and fatigue.  HENT: Negative for congestion, dental problem, drooling, ear discharge, ear pain, facial swelling, hearing loss, mouth sores, nosebleeds, postnasal drip, rhinorrhea, sinus pressure, sneezing, sore throat, tinnitus, trouble swallowing and voice change.   Eyes: Negative for photophobia, pain, discharge, redness, itching and visual disturbance.  Respiratory: Negative for cough, choking, chest tightness, shortness of breath, wheezing and stridor.   Cardiovascular: Negative for chest pain, palpitations and leg swelling.  Gastrointestinal: Negative for nausea, vomiting, abdominal pain, diarrhea, constipation, blood in stool, abdominal distention, anal bleeding and rectal pain.  Endocrine:  Negative for cold intolerance and heat intolerance.  Genitourinary: Positive for dysuria, urgency and frequency. Negative for hematuria, flank pain, decreased urine volume, vaginal bleeding, vaginal discharge, enuresis, genital sores, vaginal pain, menstrual problem and pelvic pain.  Musculoskeletal: Positive for back pain. Negative for joint swelling, arthralgias, gait  problem, neck pain and neck stiffness.  Skin: Negative for color change, pallor, rash and wound.  Allergic/Immunologic: Negative for environmental allergies and food allergies.  Neurological: Negative for dizziness, tremors, seizures, syncope, facial asymmetry, speech difficulty, weakness, light-headedness, numbness and headaches.  Hematological: Negative for adenopathy. Does not bruise/bleed easily.  Psychiatric/Behavioral: Positive for sleep disturbance. Negative for behavioral problems, confusion and agitation.    Allergies  Sulfa antibiotics  Home Medications   Prior to Admission medications   Medication Sig Start Date End Date Taking? Authorizing Provider  levonorgestrel (MIRENA) 20 MCG/24HR IUD 1 each by Intrauterine route once.   Yes Historical Provider, MD  nitrofurantoin, macrocrystal-monohydrate, (MACROBID) 100 MG capsule Take 1 capsule (100 mg total) by mouth 2 (two) times daily. 06/08/15   Barbaraann Barthel, NP  phenazopyridine (PYRIDIUM) 200 MG tablet Take 1 tablet (200 mg total) by mouth 3 (three) times daily. 05/15/15   Barbaraann Barthel, NP  phenazopyridine (PYRIDIUM) 200 MG tablet Take 1 tablet (200 mg total) by mouth 3 (three) times daily. 06/08/15   Barbaraann Barthel, NP  vitamin B-12 (CYANOCOBALAMIN) 1000 MCG tablet Inject 1,000 mcg into the muscle every 30 (thirty) days.    Historical Provider, MD   BP 111/61 mmHg  Pulse 82  Temp(Src) 97.7 F (36.5 C) (Oral)  Resp 16  Ht 5\' 10"  (1.778 m)  Wt 195 lb (88.451 kg)  BMI 27.98 kg/m2  SpO2 100% Physical Exam  Constitutional: She is oriented to person, place, and time. Vital signs are normal. She appears well-developed and well-nourished. No distress.  HENT:  Head: Normocephalic and atraumatic.  Right Ear: External ear normal.  Left Ear: External ear normal.  Nose: Nose normal.  Mouth/Throat: Oropharynx is clear and moist. No oropharyngeal exudate.  Eyes: Conjunctivae, EOM and lids are normal. Pupils are equal, round,  and reactive to light. Right eye exhibits no discharge. Left eye exhibits no discharge. No scleral icterus.  Neck: Trachea normal and normal range of motion. Neck supple. No tracheal deviation present. No thyromegaly present.  Cardiovascular: Normal rate, regular rhythm, normal heart sounds and intact distal pulses.  Exam reveals no gallop and no friction rub.   No murmur heard. Pulmonary/Chest: Effort normal and breath sounds normal. No stridor. No respiratory distress. She has no wheezes. She has no rales. She exhibits no tenderness.  Abdominal: Soft. She exhibits no shifting dullness, no distension, no pulsatile liver, no fluid wave, no abdominal bruit, no ascites, no pulsatile midline mass and no mass. Bowel sounds are decreased. There is no hepatosplenomegaly. There is no tenderness. There is no rigidity, no rebound, no guarding, no CVA tenderness, no tenderness at McBurney's point and negative Murphy's sign. Hernia confirmed negative in the ventral area.  Dull to percussion x 4 quads  Musculoskeletal: Normal range of motion. She exhibits no edema or tenderness.  Neurological: She is alert and oriented to person, place, and time. She exhibits normal muscle tone. Coordination normal.  Skin: Skin is warm, dry and intact. No rash noted. She is not diaphoretic. No erythema. No pallor.  Psychiatric: She has a normal mood and affect. Her speech is normal and behavior is normal. Judgment and thought content normal. Cognition and memory are  normal.  Nursing note and vitals reviewed.   ED Course  Procedures (including critical care time) Labs Review Labs Reviewed  URINALYSIS COMPLETEWITH MICROSCOPIC ([facility] ONLY) - Abnormal; Notable for the following:    APPearance CLOUDY (*)    Hgb urine dipstick 3+ (*)    Protein, ur TRACE (*)    Nitrite POSITIVE (*)    Leukocytes, UA 1+ (*)    Bacteria, UA FEW (*)    Squamous Epithelial / LPF 0-5 (*)    All other components within normal limits  URINE  CULTURE    Imaging Review No results found.   MDM   1. UTI (lower urinary tract infection)    Medications as directed.  Patient is also to push fluids and may use Pyridium 200mg  po TID as needed.  Hydrate, avoid dehydration.  Avoid holding urine void on frequent basis every 4 to 6 hours.  If unable to void every 8 hours follow up for re-evaluation with PCM, urgent care or ER.   Call or return to clinic as needed if these symptoms worsen or fail to improve as anticipated.  Exitcare handout on cystitis given to patient Patient verbalized agreement and understanding of treatment plan and had no further questions at this time. P2:  Hydrate and cranberry juice   Barbaraann Barthel, NP 06/08/15 [phone]

## 2015-06-10 LAB — URINE CULTURE: SPECIAL REQUESTS: NORMAL

## 2018-12-03 ENCOUNTER — Ambulatory Visit
Admission: EM | Admit: 2018-12-03 | Discharge: 2018-12-03 | Disposition: A | Discharge status: Home / Self Care | Payer: 59 | Attending: Family Medicine | Admitting: Family Medicine

## 2018-12-03 ENCOUNTER — Other Ambulatory Visit: Payer: Self-pay

## 2018-12-03 ENCOUNTER — Encounter: Payer: Self-pay | Admitting: Emergency Medicine

## 2018-12-03 DIAGNOSIS — J111 Influenza due to unidentified influenza virus with other respiratory manifestations: Secondary | ICD-10-CM

## 2018-12-03 DIAGNOSIS — R0981 Nasal congestion: Secondary | ICD-10-CM

## 2018-12-03 DIAGNOSIS — R05 Cough: Secondary | ICD-10-CM | POA: Diagnosis not present

## 2018-12-03 DIAGNOSIS — R69 Illness, unspecified: Principal | ICD-10-CM

## 2018-12-03 MED ORDER — BALOXAVIR MARBOXIL(80 MG DOSE) 2 X 40 MG PO TBPK: 80.0000 mg | ORAL_TABLET | Freq: Once | ORAL | 0 refills | Status: AC

## 2018-12-03 MED ORDER — HYDROCOD POLST-CPM POLST ER 10-8 MG/5ML PO SUER: 5.0000 mL | Freq: Every evening | ORAL | 0 refills | Status: DC | PRN

## 2018-12-03 NOTE — ED Triage Notes (Signed)
Patient in today c/o cough that started yesterday. Patient states both her children had the flu last week. Patient states she had fever (101.5) on Saturday that broke on Sunday morning. Patient has tried OTC Vicks cold/flu. Last dose of Ibuprofen was ~2am this morning.

## 2018-12-03 NOTE — ED Provider Notes (Signed)
[facility] ____________________________________________  Time seen: Approximately 9:03 AM  I have reviewed the triage vital signs and the nursing notes.   HISTORY  Chief Complaint Cough   HPI Natalie Walters is a 33 y.o. female seen for evaluation of 2 days of cough, congestion, chills, body aches.  States initially she had a sore throat, denies sore throat currently.  Dates T-max 101.5.  Has been alternating Tylenol and ibuprofen which does help.  States her kids were positive for influenza B this past week just prior to her symptom onset.  States last night she did have a few episodes of posttussive emesis.  Denies any other vomiting.  Denies abdominal pain, constipation, diarrhea or other complaints.  Continues to overall tolerate fluids well, decreased appetite.  Denies chest pain or shortness of breath.  No recent sickness.   Patient's last menstrual period was 11/27/2018 (exact date).Denies pregnancy.    Past Medical History:  Diagnosis Date  . Family history of breast cancer   . Family history of colon cancer   . Family history of ovarian cancer   . Pelvic pain in female     Patient Active Problem List   Diagnosis Date Noted  . 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 CESAREAN SECTION  2011  . CHOLECYSTECTOMY    . GALLBLADDER SURGERY  2006     No current facility-administered medications for this encounter.   Current Outpatient Medications:  .  JUNEL FE 1/20 1-20 MG-MCG tablet, , Disp: , Rfl:  .  Baloxavir Marboxil,80 MG Dose, (XOFLUZA) 2 x 40 MG TBPK, Take 80 mg by mouth once for 1 dose., Disp: 2 each, Rfl: 0 .  chlorpheniramine-HYDROcodone (TUSSIONEX PENNKINETIC ER) 10-8 MG/5ML SUER, Take 5 mLs by mouth at bedtime as needed. do not drive or operate machinery while taking as can cause drowsiness., Disp: 50 mL, Rfl: 0  Allergies Sulfa antibiotics  Family History  Problem Relation Age of Onset    . Cancer Mother        breast  . Cancer Sister        breast  . Cancer Maternal Grandmother        ovarian, breast  . Heart disease Paternal Grandfather   . Healthy Father     Social History Social History   Tobacco Use  . Smoking status: Never Smoker  . Smokeless tobacco: Never Used  Substance Use Topics  . Alcohol use: Yes    Comment: occas  . Drug use: No    Review of Systems Constitutional: Positive fever.  ENT: As above.  Cardiovascular: Denies chest pain. Respiratory: Denies shortness of breath. Gastrointestinal: No abdominal pain.  Some posttussive emesis last night, none this am. Denise nausea, diarrhea,constipation.  Genitourinary: Negative for dysuria. Musculoskeletal: Negative for back pain. Skin: Negative for rash.   ____________________________________________   PHYSICAL EXAM:  VITAL SIGNS: ED Triage Vitals  Enc Vitals Group     BP 12/03/18 0819 129/74     Pulse Rate 12/03/18 0819 94     Resp 12/03/18 0819 16     Temp 12/03/18 0819 98.2 F (36.8 C)     Temp Source 12/03/18 0819 Oral     SpO2 12/03/18 0819 99 %     Weight 12/03/18 0820 184 lb (83.5 kg)     Height 12/03/18 0820 5\' 9"  (1.753 m)     Head Circumference --      Peak Flow --  Pain Score 12/03/18 0819 0     Pain Loc --      Pain Edu? --      Excl. in GC? --     Constitutional: Alert and oriented. Well appearing and in no acute distress. Eyes: Conjunctivae are normal.  Head: Atraumatic. No sinus tenderness to palpation. No swelling. No erythema.  Ears: no erythema, normal TMs bilaterally.   Nose:Nasal congestion   Mouth/Throat: Mucous membranes are moist. No pharyngeal erythema. No tonsillar swelling or exudate.  Neck: No stridor.  No cervical spine tenderness to palpation. Hematological/Lymphatic/Immunilogical: No cervical lymphadenopathy. Cardiovascular: Normal rate, regular rhythm. Grossly normal heart sounds.  Good peripheral circulation. Respiratory: Normal respiratory  effort.  No retractions. No wheezes, rales or rhonchi. Good air movement.  Musculoskeletal: Ambulatory with steady gait.  Neurologic:  Normal speech and language. No gait instability. Skin:  Skin appears warm, dry and intact. No rash noted. Psychiatric: Mood and affect are normal. Speech and behavior are normal. ___________________________________________   LABS (all labs ordered are listed, but only abnormal results are displayed)  Labs Reviewed - No data to display ____________________________________________   PROCEDURES Procedures   INITIAL IMPRESSION / ASSESSMENT AND PLAN / ED COURSE  Pertinent labs & imaging results that were available during my care of the patient were reviewed by me and considered in my medical decision making (see chart for details).  Well-appearing patient.  No acute distress.  Patient with influenza-like symptoms.  Patient declines sore throat currently.  Will defer strep swab, patient agrees.  Discussed treatment options, will treat with oral Xofluza, PRN Tussionex at night, continue over-the-counter Tylenol, ibuprofen and congestion medication.  Rest and fluids.  Work note given.Discussed indication, risks and benefits of medications with patient.  Discussed follow up with Primary care physician this week. Discussed follow up and return parameters including no resolution or any worsening concerns. Patient verbalized understanding and agreed to plan.   ____________________________________________   FINAL CLINICAL IMPRESSION(S) / ED DIAGNOSES  Final diagnoses:  Influenza-like illness     ED Discharge Orders         Ordered    chlorpheniramine-HYDROcodone (TUSSIONEX PENNKINETIC ER) 10-8 MG/5ML SUER  At bedtime PRN     12/03/18 0857    Baloxavir Marboxil,80 MG Dose, (XOFLUZA) 2 x 40 MG TBPK   Once     12/03/18 0857           Note: This dictation was prepared with Dragon dictation along with smaller phrase technology. Any transcriptional errors  that result from this process are unintentional.         Renford Dills, NP 12/03/18 1008

## 2018-12-03 NOTE — Discharge Instructions (Signed)
Take medication as prescribed. Rest. Drink plenty of fluids. Continue over the counter as discussed.  ° °Follow up with your primary care physician this week as needed. Return to Urgent care for new or worsening concerns.  ° °

## 2019-08-28 ENCOUNTER — Ambulatory Visit
Admission: EM | Admit: 2019-08-28 | Discharge: 2019-08-28 | Disposition: A | Discharge status: Home / Self Care | Payer: 59 | Attending: Family Medicine | Admitting: Family Medicine

## 2019-08-28 ENCOUNTER — Other Ambulatory Visit: Payer: Self-pay

## 2019-08-28 DIAGNOSIS — N3001 Acute cystitis with hematuria: Secondary | ICD-10-CM | POA: Diagnosis not present

## 2019-08-28 LAB — URINALYSIS, COMPLETE (UACMP) WITH MICROSCOPIC
Bilirubin Urine: NEGATIVE
Glucose, UA: NEGATIVE mg/dL
Ketones, ur: NEGATIVE mg/dL
Nitrite: POSITIVE — AB
Protein, ur: 100 mg/dL — AB
RBC / HPF: >50 RBC/hpf (ref 0–5)
Specific Gravity, Urine: 1.025 (ref 1.005–1.030)
pH: 5.5 (ref 5.0–8.0)

## 2019-08-28 MED ORDER — CEPHALEXIN 500 MG PO CAPS: 500.0000 mg | ORAL_CAPSULE | Freq: Two times a day (BID) | ORAL | 0 refills | Status: DC

## 2019-08-28 NOTE — ED Triage Notes (Signed)
Pt. States yesterday she started having to urinate every hour, it burns when urinating.

## 2019-08-28 NOTE — ED Provider Notes (Signed)
[facility]    CSN: [id number] Arrival date & time: 08/28/19  7654  History   Chief Complaint Chief Complaint  Patient presents with  . Urinary Frequency   HPI   33 year old female presents with urinary symptoms.  Symptoms started yesterday.  Patient reports urinary frequency, urgency, and burning with urination.  No fever.  No abdominal pain.  Patient states that she has some back pain but is unsure if this is from physical activity.  No medications or interventions tried.  No known exacerbating factors.  Patient reports a history of prior UTI.  She states that she tends to respond well to Keflex.  No other associated symptoms.  No other complaints.  PMH, Surgical Hx, Family Hx, Social History reviewed and updated as below.  Past Medical History:  Diagnosis Date  . Family history of breast cancer   . Family history of colon cancer   . Family history of ovarian cancer   . Pelvic pain in female   Hx of UTI  Past Surgical History:  Procedure Laterality Date  . CESAREAN SECTION  2011  . CHOLECYSTECTOMY    . GALLBLADDER SURGERY  2006   OB History    Gravida  1   Para  1   Term  1   Preterm      AB      Living  2     SAB      TAB      Ectopic      Multiple  1   Live Births  2          Home Medications    Prior to Admission medications   Medication Sig Start Date End Date Taking? Authorizing Provider  cephALEXin (KEFLEX) 500 MG capsule Take 1 capsule (500 mg total) by mouth 2 (two) times daily. 08/28/19   Coral Spikes, DO  chlorpheniramine-HYDROcodone (TUSSIONEX PENNKINETIC ER) 10-8 MG/5ML SUER Take 5 mLs by mouth at bedtime as needed. do not drive or operate machinery while taking as can cause drowsiness. 12/03/18   Marylene Land, NP  JUNEL FE 1/20 1-20 MG-MCG tablet  09/22/18   [provider]    Family History Family History  Problem Relation Age of Onset  . Cancer Mother        breast  . Cancer Sister        breast  .  Cancer Maternal Grandmother        ovarian, breast  . Heart disease Paternal Grandfather   . Healthy Father     Social History Social History   Tobacco Use  . Smoking status: Never Smoker  . Smokeless tobacco: Never Used  Substance Use Topics  . Alcohol use: Yes    Comment: occas  . Drug use: No     Allergies   Sulfa antibiotics and Tetracyclines & related   Review of Systems Review of Systems  Gastrointestinal: Negative for abdominal pain.  Genitourinary: Positive for dysuria, frequency and urgency.  Musculoskeletal: Positive for back pain.   Physical Exam Triage Vital Signs ED Triage Vitals  Enc Vitals Group     BP 08/28/19 1003 128/79     Pulse Rate 08/28/19 1003 65     Resp --      Temp 08/28/19 1003 98.3 F (36.8 C)     Temp Source 08/28/19 1003 Oral     SpO2 08/28/19 1003 100 %     Weight 08/28/19 1000 195 lb (88.5 kg)  Height --      Head Circumference --      Peak Flow --      Pain Score 08/28/19 1000 2     Pain Loc --      Pain Edu? --      Excl. in GC? --    Updated Vital Signs BP 128/79 (BP Location: Left Arm)   Pulse 65   Temp 98.3 F (36.8 C) (Oral)   Wt 88.5 kg   LMP 08/20/2019   SpO2 100%   BMI 28.80 kg/m   Visual Acuity Right Eye Distance:   Left Eye Distance:   Bilateral Distance:    Right Eye Near:   Left Eye Near:    Bilateral Near:     Physical Exam Vitals signs and nursing note reviewed.  Constitutional:      General: She is not in acute distress.    Appearance: Normal appearance. She is not ill-appearing.  HENT:     Head: Normocephalic and atraumatic.  Eyes:     General:        Right eye: No discharge.        Left eye: No discharge.     Conjunctiva/sclera: Conjunctivae normal.  Cardiovascular:     Rate and Rhythm: Normal rate and regular rhythm.     Heart sounds: No murmur.  Pulmonary:     Effort: Pulmonary effort is normal.     Breath sounds: Normal breath sounds. No wheezing, rhonchi or rales.   Abdominal:     Tenderness: There is no right CVA tenderness or left CVA tenderness.  Neurological:     Mental Status: She is alert.  Psychiatric:        Mood and Affect: Mood normal.        Behavior: Behavior normal.    UC Treatments / Results  Labs (all labs ordered are listed, but only abnormal results are displayed) Labs Reviewed  URINALYSIS, COMPLETE (UACMP) WITH MICROSCOPIC - Abnormal; Notable for the following components:      Result Value   APPearance HAZY (*)    Hgb urine dipstick LARGE (*)    Protein, ur 100 (*)    Nitrite POSITIVE (*)    Leukocytes,Ua SMALL (*)    Bacteria, UA FEW (*)    All other components within normal limits  URINE CULTURE    EKG   Radiology No results found.  Procedures Procedures (including critical care time)  Medications Ordered in UC Medications - No data to display  Initial Impression / Assessment and Plan / UC Course  I have reviewed the triage vital signs and the nursing notes.  Pertinent labs & imaging results that were available during my care of the patient were reviewed by me and considered in my medical decision making (see chart for details).    33 year old female presents with UTI.  Treating with Keflex.  Sending culture.  Final Clinical Impressions(s) / UC Diagnoses   Final diagnoses:  Acute cystitis with hematuria     Discharge Instructions     Medication as prescribed.  Take care  Dr. Adriana Simas     ED Prescriptions    Medication Sig Dispense Auth. Provider   cephALEXin (KEFLEX) 500 MG capsule Take 1 capsule (500 mg total) by mouth 2 (two) times daily. 14 capsule Everlene Other G, DO     PDMP not reviewed this encounter.   Tommie Sams, [place] 08/28/19 1042

## 2019-08-28 NOTE — Discharge Instructions (Signed)
Medication as prescribed.  Take care  Dr. Kimon Loewen  

## 2019-08-31 LAB — URINE CULTURE: Culture: >=100000 — AB

## 2019-09-02 ENCOUNTER — Telehealth (HOSPITAL_COMMUNITY): Payer: Self-pay | Admitting: Emergency Medicine

## 2019-09-02 NOTE — Telephone Encounter (Signed)
Urine culture was positive for ESCHERICHIA COLI  and was given keflex  at urgent care visit.Attempted to reach patient. No answer at this time.   

## 2019-09-09 ENCOUNTER — Other Ambulatory Visit: Payer: Self-pay

## 2019-09-09 ENCOUNTER — Other Ambulatory Visit: Payer: Self-pay | Admitting: Radiology

## 2019-09-09 ENCOUNTER — Other Ambulatory Visit: Payer: Self-pay | Admitting: Obstetrics and Gynecology

## 2019-09-09 DIAGNOSIS — N63 Unspecified lump in unspecified breast: Secondary | ICD-10-CM

## 2019-09-11 ENCOUNTER — Ambulatory Visit
Admission: RE | Admit: 2019-09-11 | Discharge: 2019-09-11 | Disposition: A | Discharge status: Home / Self Care | Payer: 59 | Source: Ambulatory Visit | Attending: Obstetrics and Gynecology | Admitting: Obstetrics and Gynecology

## 2019-09-11 ENCOUNTER — Other Ambulatory Visit: Payer: Self-pay

## 2019-09-11 DIAGNOSIS — N63 Unspecified lump in unspecified breast: Secondary | ICD-10-CM

## 2020-10-24 ENCOUNTER — Other Ambulatory Visit: Payer: 59

## 2020-10-24 ENCOUNTER — Other Ambulatory Visit: Payer: Self-pay

## 2020-10-24 DIAGNOSIS — Z20822 Contact with and (suspected) exposure to covid-19: Secondary | ICD-10-CM

## 2020-10-27 LAB — NOVEL CORONAVIRUS, NAA: SARS-CoV-2, NAA: NOT DETECTED

## 2021-04-01 IMAGING — MG DIGITAL DIAGNOSTIC BILAT W/ TOMO W/ CAD
5 of 10 series · 5 of 30 positions shown · non-contrast
Comparison: None

ACR Breast Density Category  choose 3
COMPARISON: None

ACR Breast Density Category  choose 3

Addendum:
CLINICAL DATA: 33-year-old patient recently palpated a lump in the
upper-outer quadrant of the right breast, 1 o'clock region. This is
her baseline mammogram.

EXAM:
DIGITAL DIAGNOSTIC BILATERAL MAMMOGRAM WITH CAD AND TOMO
ULTRASOUND LEFT BREAST

[L MLO synth-2D]
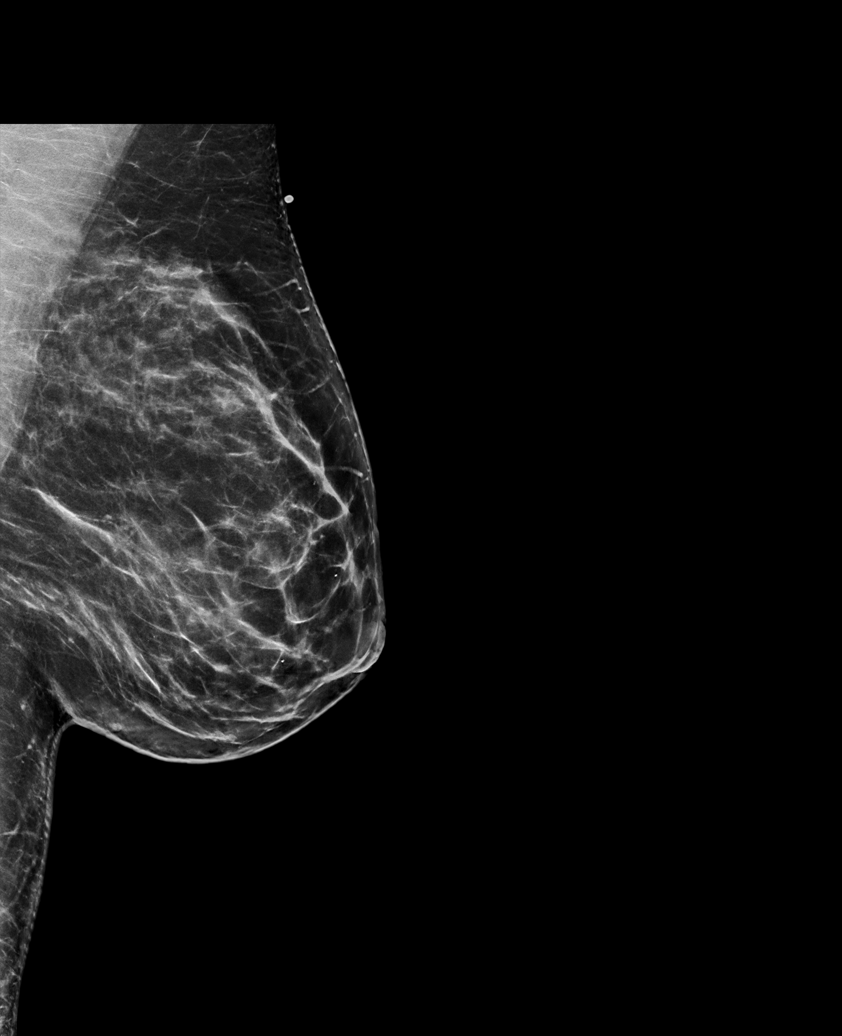

[L CC synth-2D]
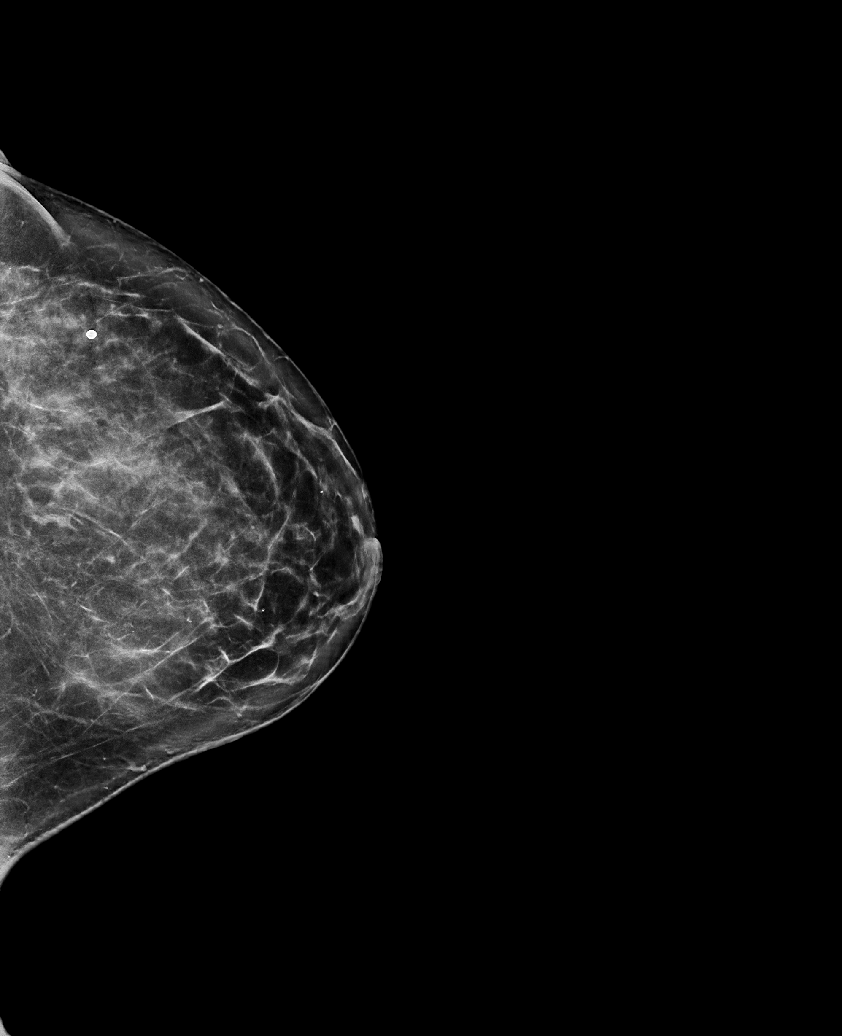

[R CC synth-2D]
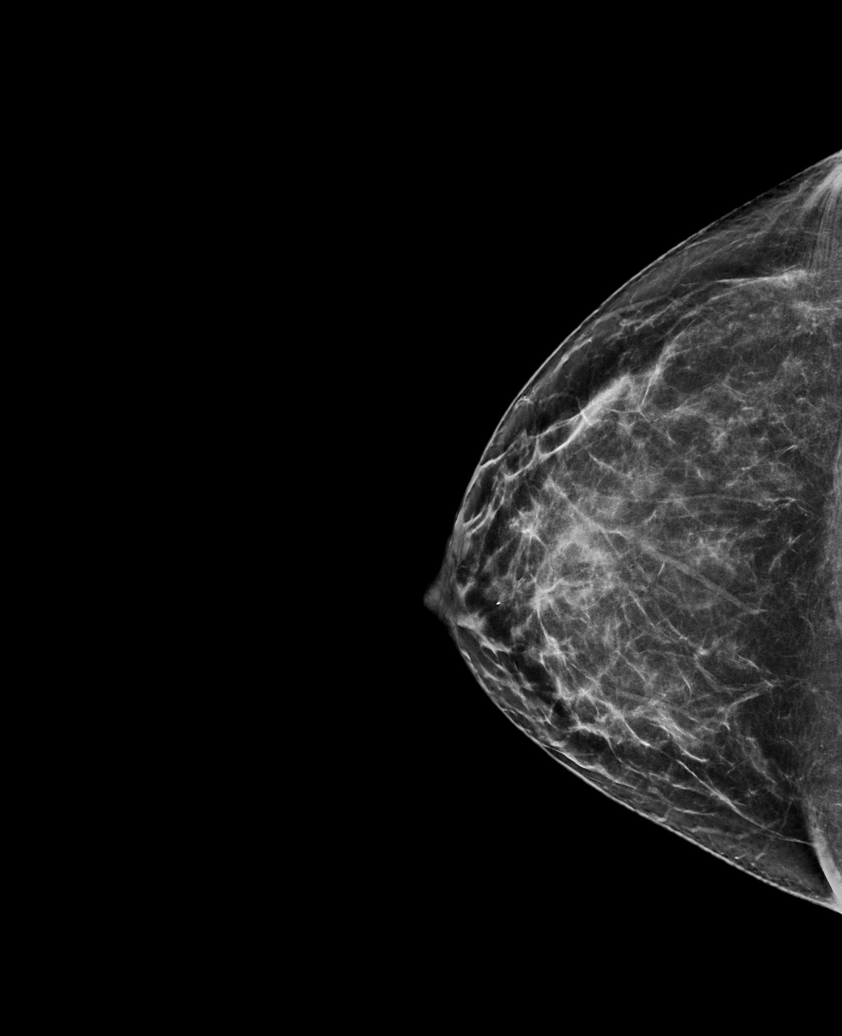

[R MLO synth-2D]
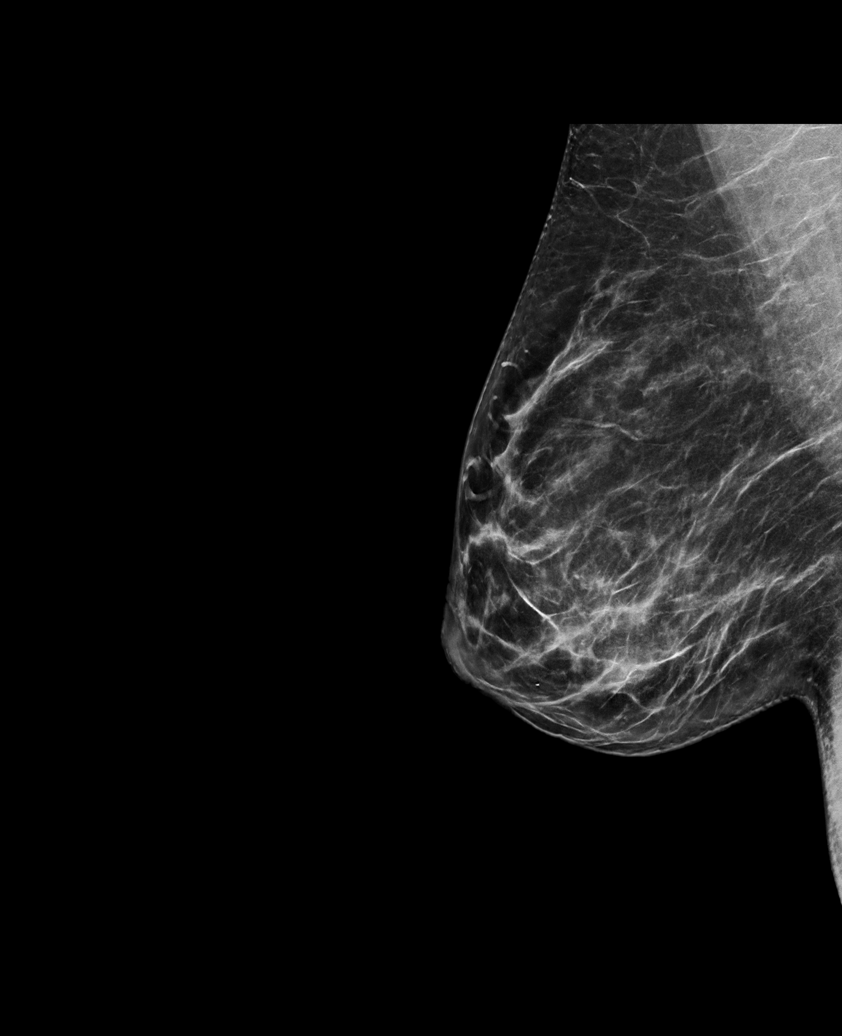

[L TAN synth-2D]
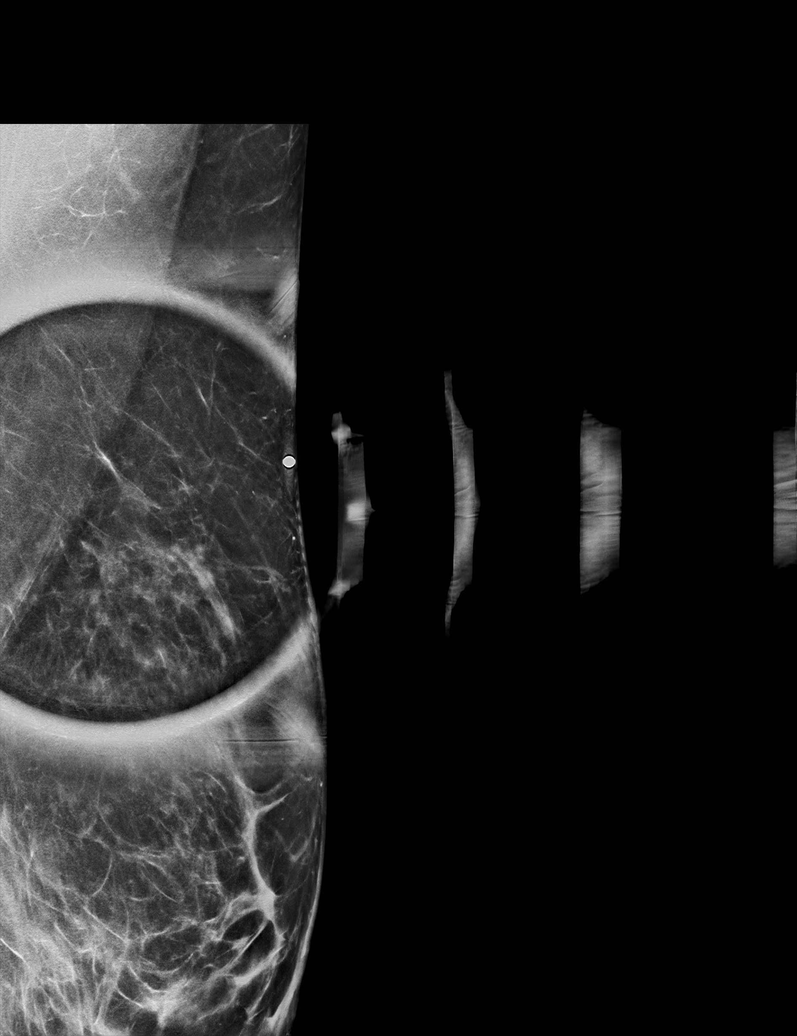

[5 of 30 positions shown; findings below may reference images not displayed]

FINDINGS: A metallic skin marker was placed in the region of patient concern
in the superior left breast. Spot tangential view of this region
shows normal fibroglandular tissue, with no mass, distortion, or
suspicious microcalcification.

No mass, distortion, or suspicious microcalcification is identified
in either breast to suggest malignancy. Imaged axillary regions are
unremarkable. Skin thickness is normal bilaterally.

Mammographic images were processed with CAD.

On physical exam, I palpate normal breast tissue in the upper-outer
quadrant of the left breast in the region of recent patient concern.
I do not palpate a discrete mass or thickening.

Targeted ultrasound is performed, showing normal fibroglandular
tissue in the upper-outer quadrant of the left breast, in the region
of patient concern. No solid or cystic mass or abnormal shadowing is
identified.
IMPRESSION: No evidence of malignancy in either breast. Normal breast parenchyma
is imaged in the region of patient concern in the upper-outer left
breast.

RECOMMENDATION:
Screening mammogram at age 40 unless there are persistent or
intervening clinical concerns. (Code:J0-X-ZD7)

I have discussed the findings and recommendations with the patient.
If applicable, a reminder letter will be sent to the patient
regarding the next appointment.

BI-RADS CATEGORY  1: Negative.

ADDENDUM:
This addendum is created to correct an error in the ACR breast
density category. It should read as follows:

ACR Breast Density: c: The breast tissue is heterogeneously dense,
which may obscure small masses.

*** End of Addendum ***
FINDINGS: A metallic skin marker was placed in the region of patient concern
in the superior left breast. Spot tangential view of this region
shows normal fibroglandular tissue, with no mass, distortion, or
suspicious microcalcification.

No mass, distortion, or suspicious microcalcification is identified
in either breast to suggest malignancy. Imaged axillary regions are
unremarkable. Skin thickness is normal bilaterally.

Mammographic images were processed with CAD.

On physical exam, I palpate normal breast tissue in the upper-outer
quadrant of the left breast in the region of recent patient concern.
I do not palpate a discrete mass or thickening.

Targeted ultrasound is performed, showing normal fibroglandular
tissue in the upper-outer quadrant of the left breast, in the region
of patient concern. No solid or cystic mass or abnormal shadowing is
identified.
IMPRESSION: No evidence of malignancy in either breast. Normal breast parenchyma
is imaged in the region of patient concern in the upper-outer left
breast.

RECOMMENDATION:
Screening mammogram at age 40 unless there are persistent or
intervening clinical concerns. (Code:J0-X-ZD7)

I have discussed the findings and recommendations with the patient.
If applicable, a reminder letter will be sent to the patient
regarding the next appointment.

BI-RADS CATEGORY  1: Negative.

## 2021-05-11 ENCOUNTER — Other Ambulatory Visit: Payer: Self-pay

## 2021-05-11 ENCOUNTER — Encounter: Payer: Self-pay | Admitting: Emergency Medicine

## 2021-05-11 ENCOUNTER — Ambulatory Visit
Admission: EM | Admit: 2021-05-11 | Discharge: 2021-05-11 | Disposition: A | Discharge status: Home / Self Care | Payer: 59 | Attending: Family Medicine | Admitting: Family Medicine

## 2021-05-11 DIAGNOSIS — N3000 Acute cystitis without hematuria: Secondary | ICD-10-CM | POA: Insufficient documentation

## 2021-05-11 LAB — POCT URINALYSIS DIP (MANUAL ENTRY)
Bilirubin, UA: NEGATIVE
Glucose, UA: NEGATIVE mg/dL
Leukocytes, UA: NEGATIVE
Nitrite, UA: NEGATIVE
Protein Ur, POC: NEGATIVE mg/dL
Spec Grav, UA: 1.015 (ref 1.010–1.025)
Urobilinogen, UA: 0.2 E.U./dL
pH, UA: 6 (ref 5.0–8.0)

## 2021-05-11 LAB — POCT URINE PREGNANCY: Preg Test, Ur: NEGATIVE

## 2021-05-11 NOTE — ED Provider Notes (Signed)
[facility]    CSN: [id number] Arrival date & time: 05/11/21  0801      History   Chief Complaint Chief Complaint  Patient presents with   Urinary Frequency   Urinary Retention    HPI Natalie Walters is a 35 y.o. female.   HPI Patient with a history of recurrent urinary tract infections presents today with urinary frequency, lower pelvic discomfort and sensation of urinary retention.  Patient reports the symptoms have been ongoing for little over a week.  She reached out to her OB/GYN and was prescribed a 5-day course of Macrobid which she took intermittently and consistently over the last 72 hours and continues to have symptoms of a UTI.  Review of EMR prior urine cultures revealed E. coli which is sensitive to Macrobid.  Patient is negative for fever, nausea or vomiting.  Past Medical History:  Diagnosis Date   Family history of breast cancer    Family history of colon cancer    Family history of ovarian cancer    Pelvic pain in female     There are no problems to display for this patient.   Past Surgical History:  Procedure Laterality Date   CESAREAN SECTION  2011   CHOLECYSTECTOMY     GALLBLADDER SURGERY  2006    OB History     Gravida  1   Para  1   Term  1   Preterm  0   AB  0   Living  2      SAB  0   IAB  0   Ectopic  0   Multiple  1   Live Births  2            Home Medications    Prior to Admission medications   Medication Sig Start Date End Date Taking? Authorizing Provider  JUNEL FE 1/20 1-20 MG-MCG tablet  09/22/18  Yes [provider]  cephALEXin (KEFLEX) 500 MG capsule Take 1 capsule (500 mg total) by mouth 2 (two) times daily. 08/28/19   Tommie Sams, DO  chlorpheniramine-HYDROcodone (TUSSIONEX PENNKINETIC ER) 10-8 MG/5ML SUER Take 5 mLs by mouth at bedtime as needed. do not drive or operate machinery while taking as can cause drowsiness. 12/03/18   Renford Dills, NP    Family History Family  History  Problem Relation Age of Onset   Cancer Mother        breast   Cancer Sister        breast   Cancer Maternal Grandmother        ovarian, breast   Heart disease Paternal Grandfather    Healthy Father     Social History Social History   Tobacco Use   Smoking status: Never   Smokeless tobacco: Never  Vaping Use   Vaping Use: Never used  Substance Use Topics   Alcohol use: Yes    Comment: occas   Drug use: No     Allergies   Sulfa antibiotics and Tetracyclines & related   Review of Systems Review of Systems Pertinent negatives listed in HPI   Physical Exam Triage Vital Signs ED Triage Vitals  Enc Vitals Group     BP 05/11/21 0810 125/82     Pulse Rate 05/11/21 0810 67     Resp 05/11/21 0810 18     Temp 05/11/21 0810 98.2 F (36.8 C)     Temp Source 05/11/21 0810 Oral     SpO2 05/11/21 0810 98 %  Weight --      Height --      Head Circumference --      Peak Flow --      Pain Score 05/11/21 0813 2     Pain Loc --      Pain Edu? --      Excl. in GC? --    No data found.  Updated Vital Signs BP 125/82 (BP Location: Left Arm)   Pulse 67   Temp 98.2 F (36.8 C) (Oral)   Resp 18   LMP 04/27/2021   SpO2 98%   Visual Acuity Right Eye Distance:   Left Eye Distance:   Bilateral Distance:    Right Eye Near:   Left Eye Near:    Bilateral Near:     Physical Exam General appearance: Alert, well developed, well nourished, cooperative Head: Normocephalic, without obvious abnormality, atraumatic Respiratory: Respirations even and unlabored, normal respiratory rate Heart: Rate and rhythm normal.  Abdomen: No CVA  Skin: Skin color, texture, turgor normal. No rashes seen  Psych: Appropriate mood and affect. Neurologic: GCS 15, normal coordination, normal gait UC Treatments / Results  Labs (all labs ordered are listed, but only abnormal results are displayed) Labs Reviewed  POCT URINALYSIS DIP (MANUAL ENTRY) - Abnormal; Notable for the  following components:      Result Value   Ketones, POC UA large (80) (*)    Blood, UA small (*)    All other components within normal limits  URINE CULTURE  POCT URINE PREGNANCY    EKG   Radiology No results found.  Procedures Procedures (including critical care time)  Medications Ordered in UC Medications - No data to display  Initial Impression / Assessment and Plan / UC Course  I have reviewed the triage vital signs and the nursing notes.  Pertinent labs & imaging results that were available during my care of the patient were reviewed by me and considered in my medical decision making (see chart for details).  Acute recurrent cystitis, advised patient to complete 1 day remaining dose of Macrobid prescribed by OB/GYN while awaiting urine culture.  If any bacteria present in urine and she continues to have symptoms we can continue therapy with the course of antibiotics indicated by results of urine culture.  If urine culture is negative advised to follow-up with OB/GYN to consider referral for urologist given patient's history of recurrent UTIs. Patient verbalized understanding agreement with plan. Final Clinical Impressions(s) / UC Diagnoses   Final diagnoses:  Acute recurrent cystitis     Discharge Instructions      Your urine here in clinic is free of bacteria. I will culture urine to evaluation for presence of microscopic bacteria. If urine culture is abnormal, we will prescribed medication accordingly. If culture is normal, I recommend scheduling a follow-up with OBGYN for further evaluation or to discuss a referral to urologist.     ED Prescriptions   None    PDMP not reviewed this encounter.   Bing Neighbors, [place] 05/11/21 [phone]

## 2021-05-11 NOTE — ED Triage Notes (Signed)
Patient c/o urinary frequency and retention   Patient denies fever.   Patient endorses "burning with peeing" and fatigue.   Patient was prescribed Macrobid for 5 days by [facility] provider. Patient stated symptoms aren't resolved.   LMP was 2 weeks ago per patient statement.

## 2021-05-11 NOTE — Discharge Instructions (Addendum)
Your urine here in clinic is free of bacteria. I will culture urine to evaluation for presence of microscopic bacteria. If urine culture is abnormal, we will prescribed medication accordingly. If culture is normal, I recommend scheduling a follow-up with OBGYN for further evaluation or to discuss a referral to urologist.

## 2021-05-12 LAB — URINE CULTURE: Culture: NO GROWTH

## 2021-07-28 ENCOUNTER — Telehealth: Payer: 59 | Admitting: Family

## 2021-07-28 DIAGNOSIS — R399 Unspecified symptoms and signs involving the genitourinary system: Secondary | ICD-10-CM | POA: Diagnosis not present

## 2021-07-28 MED ORDER — CEPHALEXIN 500 MG PO CAPS: 500.0000 mg | ORAL_CAPSULE | Freq: Two times a day (BID) | ORAL | 0 refills | Status: AC

## 2021-07-28 NOTE — Progress Notes (Signed)
Virtual Visit Consent   Natalie Walters, you are scheduled for a virtual visit with a [facility] provider today.     Just as with appointments in the office, your consent must be obtained to participate.  Your consent will be active for this visit and any virtual visit you may have with one of our providers in the next 365 days.     If you have a MyChart account, a copy of this consent can be sent to you electronically.  All virtual visits are billed to your insurance company just like a traditional visit in the office.    As this is a virtual visit, video technology does not allow for your provider to perform a traditional examination.  This may limit your provider's ability to fully assess your condition.  If your provider identifies any concerns that need to be evaluated in person or the need to arrange testing (such as labs, EKG, etc.), we will make arrangements to do so.     Although advances in technology are sophisticated, we cannot ensure that it will always work on either your end or our end.  If the connection with a video visit is poor, the visit may have to be switched to a telephone visit.  With either a video or telephone visit, we are not always able to ensure that we have a secure connection.     I need to obtain your verbal consent now.   Are you willing to proceed with your visit today?    Natalie Walters has provided verbal consent on 07/28/2021 for a virtual visit (video or telephone).   Jannifer Rodney, FNP   Date: 07/28/2021 7:11 PM   Virtual Visit via Video Note   I, Jannifer Rodney, connected with  Natalie Walters  ([id number], [birth year]) on 07/28/21 at  7:00 PM EDT by a video-enabled telemedicine application and verified that I am speaking with the correct person using two identifiers.  Location: Patient: Virtual Visit Location Patient: Car Provider: Virtual Visit Location Provider: Home   I discussed the limitations of evaluation and  management by telemedicine and the availability of in person appointments. The patient expressed understanding and agreed to proceed.    History of Present Illness: Natalie Walters is a 34 y.o. who identifies as a female who was assigned female at birth, and is being seen today for UTI symptoms. She had a UTI at her gyn office and was started on Macrobid and completed this Saturday.   HPI: Urinary Frequency  This is a recurrent problem. The current episode started yesterday. The problem occurs intermittently. The pain is at a severity of 2/10. The pain is mild. There has been no fever. Associated symptoms include frequency, hematuria, hesitancy and urgency. Pertinent negatives include no nausea or vomiting. She has tried increased fluids and antibiotics for the symptoms. The treatment provided mild relief.   Problems: There are no problems to display for this patient.   Allergies:  Allergies  Allergen Reactions   Sulfa Antibiotics Hives   Tetracyclines & Related    Medications:  Current Outpatient Medications:    cephALEXin (KEFLEX) 500 MG capsule, Take 1 capsule (500 mg total) by mouth 2 (two) times daily., Disp: 14 capsule, Rfl: 0   JUNEL FE 1/20 1-20 MG-MCG tablet, , Disp: , Rfl:   Observations/Objective: Patient is well-developed, well-nourished in no acute distress.  Resting comfortably in car. Head is normocephalic, atraumatic.  No labored breathing.  Speech is clear and coherent  with logical content.  Patient is alert and oriented at baseline.    Assessment and Plan: 1. UTI symptoms - cephALEXin (KEFLEX) 500 MG capsule; Take 1 capsule (500 mg total) by mouth 2 (two) times daily.  Dispense: 14 capsule; Refill: 0 Force fluids AZO over the counter X2 days Follow up if symptoms worsen or do not improve    Follow Up Instructions: I discussed the assessment and treatment plan with the patient. The patient was provided an opportunity to ask questions and all were  answered. The patient agreed with the plan and demonstrated an understanding of the instructions.  A copy of instructions were sent to the patient via MyChart unless otherwise noted below.     The patient was advised to call back or seek an in-person evaluation if the symptoms worsen or if the condition fails to improve as anticipated.  Time:  I spent 7 minutes with the patient via telehealth technology discussing the above problems/concerns.    Jannifer Rodney, FNP

## 2021-07-28 NOTE — Patient Instructions (Signed)
Urinary Tract Infection, Adult A urinary tract infection (UTI) is an infection of any part of the urinary tract. The urinary tract includes the kidneys, ureters, bladder, and urethra. These organs make, store, and get rid of urine in the body. An upper UTI affects the ureters and kidneys. A lower UTI affects the bladder and urethra. What are the causes? Most urinary tract infections are caused by bacteria in your genital area around your urethra, where urine leaves your body. These bacteria grow and cause inflammation of your urinary tract. What increases the risk? You are more likely to develop this condition if: You have a urinary catheter that stays in place. You are not able to control when you urinate or have a bowel movement (incontinence). You are female and you: Use a spermicide or diaphragm for birth control. Have low estrogen levels. Are pregnant. You have certain genes that increase your risk. You are sexually active. You take antibiotic medicines. You have a condition that causes your flow of urine to slow down, such as: An enlarged prostate, if you are female. Blockage in your urethra. A kidney stone. A nerve condition that affects your bladder control (neurogenic bladder). Not getting enough to drink, or not urinating often. You have certain medical conditions, such as: Diabetes. A weak disease-fighting system (immunesystem). Sickle cell disease. Gout. Spinal cord injury. What are the signs or symptoms? Symptoms of this condition include: Needing to urinate right away (urgency). Frequent urination. This may include small amounts of urine each time you urinate. Pain or burning with urination. Blood in the urine. Urine that smells bad or unusual. Trouble urinating. Cloudy urine. Vaginal discharge, if you are female. Pain in the abdomen or the lower back. You may also have: Vomiting or a decreased appetite. Confusion. Irritability or tiredness. A fever or  chills. Diarrhea. The first symptom in older adults may be confusion. In some cases, they may not have any symptoms until the infection has worsened. How is this diagnosed? This condition is diagnosed based on your medical history and a physical exam. You may also have other tests, including: Urine tests. Blood tests. Tests for STIs (sexually transmitted infections). If you have had more than one UTI, a cystoscopy or imaging studies may be done to determine the cause of the infections. How is this treated? Treatment for this condition includes: Antibiotic medicine. Over-the-counter medicines to treat discomfort. Drinking enough water to stay hydrated. If you have frequent infections or have other conditions such as a kidney stone, you may need to see a health care provider who specializes in the urinary tract (urologist). In rare cases, urinary tract infections can cause sepsis. Sepsis is a life-threatening condition that occurs when the body responds to an infection. Sepsis is treated in the hospital with IV antibiotics, fluids, and other medicines. Follow these instructions at home: Medicines Take over-the-counter and prescription medicines only as told by your health care provider. If you were prescribed an antibiotic medicine, take it as told by your health care provider. Do not stop using the antibiotic even if you start to feel better. General instructions Make sure you: Empty your bladder often and completely. Do not hold urine for long periods of time. Empty your bladder after sex. Wipe from front to back after urinating or having a bowel movement if you are female. Use each tissue only one time when you wipe. Drink enough fluid to keep your urine pale yellow. Keep all follow-up visits. This is important. Contact a health care provider   if: Your symptoms do not get better after 1-2 days. Your symptoms go away and then return. Get help right away if: You have severe pain in your  back or your lower abdomen. You have a fever or chills. You have nausea or vomiting. Summary A urinary tract infection (UTI) is an infection of any part of the urinary tract, which includes the kidneys, ureters, bladder, and urethra. Most urinary tract infections are caused by bacteria in your genital area. Treatment for this condition often includes antibiotic medicines. If you were prescribed an antibiotic medicine, take it as told by your health care provider. Do not stop using the antibiotic even if you start to feel better. Keep all follow-up visits. This is important. This information is not intended to replace advice given to you by your health care provider. Make sure you discuss any questions you have with your health care provider. Document Revised: 05/15/2020 Document Reviewed: 05/15/2020 Elsevier Patient Education  2022 Elsevier Inc.  

## 2021-10-04 ENCOUNTER — Other Ambulatory Visit: Payer: Self-pay

## 2021-10-04 ENCOUNTER — Ambulatory Visit
Admission: RE | Admit: 2021-10-04 | Discharge: 2021-10-04 | Disposition: A | Discharge status: Home / Self Care | Payer: 59 | Source: Ambulatory Visit | Attending: Family Medicine | Admitting: Family Medicine

## 2021-10-04 VITALS — BP 142/80 | HR 101 | Temp 99.2°F | Resp 18

## 2021-10-04 DIAGNOSIS — H6123 Impacted cerumen, bilateral: Secondary | ICD-10-CM

## 2021-10-04 DIAGNOSIS — H66001 Acute suppurative otitis media without spontaneous rupture of ear drum, right ear: Secondary | ICD-10-CM | POA: Diagnosis not present

## 2021-10-04 DIAGNOSIS — Z1152 Encounter for screening for COVID-19: Secondary | ICD-10-CM

## 2021-10-04 DIAGNOSIS — J069 Acute upper respiratory infection, unspecified: Secondary | ICD-10-CM

## 2021-10-04 LAB — POCT INFLUENZA A/B
Influenza A, POC: NEGATIVE
Influenza B, POC: NEGATIVE

## 2021-10-04 MED ORDER — AZITHROMYCIN 250 MG PO TABS: ORAL_TABLET | ORAL | 0 refills | Status: AC

## 2021-10-04 MED ORDER — LIDOCAINE VISCOUS HCL 2 % MT SOLN
15.0000 mL | OROMUCOSAL | 0 refills | Status: AC | PRN
Start: 2021-10-04 — End: ?

## 2021-10-04 NOTE — Discharge Instructions (Signed)
Your COVID/FLU results should result within 3 days.  Negative results are immediately resulted to Mychart. Positive results will receive a follow-up call from our clinic. If symptoms are present, I recommend home quarantine until results are known.  Alternate Tylenol and ibuprofen as needed for body aches and fever.  Symptom management per recommendations discussed today.  If any breathing difficulty or chest pain develops go immediately to the closest emergency department for evaluation.

## 2021-10-04 NOTE — ED Provider Notes (Signed)
Renaldo Fiddler    CSN: [id number] Arrival date & time: 10/04/21  0940      History   Chief Complaint Chief Complaint  Patient presents with   Fever   Sore Throat   Otalgia    HPI Natalie Walters is a 35 y.o. female.   HPI Patient presents today with flu like symptoms of fever, sore throat, ear pain R>L, fatigue and fever. Recent close exposure to someone positive for COVID-19, although admits close exposure was brief. Taking OTC for management of fever. No red flag symptoms of wheezing, chest pain, or SOB. Past Medical History:  Diagnosis Date   Family history of breast cancer    Family history of colon cancer    Family history of ovarian cancer    Pelvic pain in female     There are no problems to display for this patient.   Past Surgical History:  Procedure Laterality Date   CESAREAN SECTION  2011   CHOLECYSTECTOMY     GALLBLADDER SURGERY  2006    OB History     Gravida  1   Para  1   Term  1   Preterm  0   AB  0   Living  2      SAB  0   IAB  0   Ectopic  0   Multiple  1   Live Births  2            Home Medications    Prior to Admission medications   Medication Sig Start Date End Date Taking? Authorizing Provider  azithromycin (ZITHROMAX) 250 MG tablet Take 2 tabs PO x 1 dose, then 1 tab PO QD x 4 days 10/04/21  Yes Bing Neighbors, FNP  lidocaine (XYLOCAINE) 2 % solution Use as directed 15 mLs in the mouth or throat every 3 (three) hours as needed for mouth pain (mix with warm water  gargle and spit). 10/04/21  Yes Bing Neighbors, FNP  cephALEXin (KEFLEX) 500 MG capsule Take 1 capsule (500 mg total) by mouth 2 (two) times daily. 07/28/21   Junie Spencer, FNP  JUNEL FE 1/20 1-20 MG-MCG tablet  09/22/18   [provider]    Family History Family History  Problem Relation Age of Onset   Cancer Mother        breast   Cancer Sister        breast   Cancer Maternal Grandmother        ovarian,  breast   Heart disease Paternal Grandfather    Healthy Father     Social History Social History   Tobacco Use   Smoking status: Never   Smokeless tobacco: Never  Vaping Use   Vaping Use: Never used  Substance Use Topics   Alcohol use: Yes    Comment: occas   Drug use: No     Allergies   Sulfa antibiotics and Tetracyclines & related   Review of Systems Review of Systems Pertinent negatives listed in HPI  Physical Exam Triage Vital Signs ED Triage Vitals [10/04/21 1023]  Enc Vitals Group     BP      Pulse      Resp      Temp      Temp src      SpO2      Weight      Height      Head Circumference      Peak Flow  Pain Score 6     Pain Loc      Pain Edu?      Excl. in GC?    No data found.  Updated Vital Signs BP (!) 142/80    Pulse (!) 101    Temp 99.2 F (37.3 C)    Resp 18    SpO2 96%   Visual Acuity Right Eye Distance:   Left Eye Distance:   Bilateral Distance:    Right Eye Near:   Left Eye Near:    Bilateral Near:     Physical Exam Constitutional:      Appearance: She is ill-appearing. She is not toxic-appearing.  HENT:     Head: Normocephalic and atraumatic.     Right Ear: Swelling and tenderness present. Tympanic membrane is erythematous.     Left Ear: Tympanic membrane and ear canal normal.     Ears:     Comments: Bilateral cerumen impaction prior to ear lavage     Mouth/Throat:     Pharynx: Pharyngeal swelling and posterior oropharyngeal erythema present. No oropharyngeal exudate.  Eyes:     Conjunctiva/sclera: Conjunctivae normal.     Pupils: Pupils are equal, round, and reactive to light.  Cardiovascular:     Rate and Rhythm: Regular rhythm. Tachycardia present.  Pulmonary:     Effort: Pulmonary effort is normal.     Breath sounds: Normal breath sounds.  Lymphadenopathy:     Cervical: Cervical adenopathy present.  Skin:    General: Skin is warm.     Capillary Refill: Capillary refill takes less than 2 seconds.   Neurological:     General: No focal deficit present.     Mental Status: She is alert.     UC Treatments / Results  Labs (all labs ordered are listed, but only abnormal results are displayed) Labs Reviewed  COVID-19, FLU A+B NAA  POCT INFLUENZA A/B    EKG   Radiology No results found.  Procedures Procedures (including critical care time)  Medications Ordered in UC Medications - No data to display  Initial Impression / Assessment and Plan / UC Course  I have reviewed the triage vital signs and the nursing notes.  Pertinent labs & imaging results that were available during my care of the patient were reviewed by me and considered in my medical decision making (see chart for details).     Bilateral cerumen impaction, nurse performed ear lavage, patient tolerated cerumen successfully removed from bilateral ears with visible TM. Viral URI, rapid flu negative, PCR flu and COVID pending. Symptom management warranted only.  Strict return precautions if symptoms worsen or do not readily improve. Final Clinical Impressions(s) / UC Diagnoses   Final diagnoses:  Viral URI  Non-recurrent acute suppurative otitis media of right ear without spontaneous rupture of tympanic membrane  Encounter for screening for COVID-19  Bilateral hearing loss due to cerumen impaction     Discharge Instructions      Your COVID/FLU results should result within 3 days.  Negative results are immediately resulted to Mychart. Positive results will receive a follow-up call from our clinic. If symptoms are present, I recommend home quarantine until results are known.  Alternate Tylenol and ibuprofen as needed for body aches and fever.  Symptom management per recommendations discussed today.  If any breathing difficulty or chest pain develops go immediately to the closest emergency department for evaluation.      ED Prescriptions     Medication Sig Dispense Auth. Provider  azithromycin  (ZITHROMAX) 250 MG tablet Take 2 tabs PO x 1 dose, then 1 tab PO QD x 4 days 6 tablet Bing Neighbors, FNP   lidocaine (XYLOCAINE) 2 % solution Use as directed 15 mLs in the mouth or throat every 3 (three) hours as needed for mouth pain (mix with warm water  gargle and spit). 100 mL Bing Neighbors, FNP      PDMP not reviewed this encounter.   Bing Neighbors, FNP 10/04/21 [phone]

## 2021-10-04 NOTE — ED Triage Notes (Signed)
Pt here with right ear pain, bilateral cerumen impaction, sore throat and fever x 2 days. Pt was in close contact with COVID positive patient.

## 2021-10-05 LAB — COVID-19, FLU A+B NAA
Influenza A, NAA: NOT DETECTED
Influenza B, NAA: NOT DETECTED
SARS-CoV-2, NAA: NOT DETECTED

## 2022-03-22 ENCOUNTER — Other Ambulatory Visit: Payer: Self-pay | Admitting: Radiology

## 2022-03-23 NOTE — Telephone Encounter (Signed)
Patient has not been seen at this office. No appointment scheduled.

## 2022-03-25 ENCOUNTER — Other Ambulatory Visit: Payer: Self-pay | Admitting: Radiology

## 2022-03-25 NOTE — Telephone Encounter (Signed)
If patient is switching to here I will gladly refill, otherwise she needs to contact [facility]

## 2022-06-09 ENCOUNTER — Other Ambulatory Visit: Payer: Self-pay | Admitting: Radiology

## 2022-06-30 ENCOUNTER — Emergency Department
Admission: EM | Admit: 2022-06-30 | Discharge: 2022-06-30 | Disposition: A | Discharge status: Home / Self Care | Payer: 59 | Attending: Emergency Medicine | Admitting: Emergency Medicine

## 2022-06-30 ENCOUNTER — Ambulatory Visit
Admission: EM | Admit: 2022-06-30 | Discharge: 2022-06-30 | Payer: 59 | Attending: Family Medicine | Admitting: Family Medicine

## 2022-06-30 ENCOUNTER — Other Ambulatory Visit: Payer: Self-pay

## 2022-06-30 DIAGNOSIS — T782XXA Anaphylactic shock, unspecified, initial encounter: Secondary | ICD-10-CM | POA: Diagnosis not present

## 2022-06-30 DIAGNOSIS — T7840XA Allergy, unspecified, initial encounter: Secondary | ICD-10-CM | POA: Diagnosis present

## 2022-06-30 MED ORDER — EPINEPHRINE 0.3 MG/0.3ML IJ SOAJ: 0.3000 mg | INTRAMUSCULAR | 0 refills | Status: AC | PRN

## 2022-06-30 MED ORDER — EPINEPHRINE 0.3 MG/0.3ML IJ SOAJ
0.3000 mg | Freq: Once | INTRAMUSCULAR | Status: AC
  Administered 2022-06-30: 0.3 mg via INTRAMUSCULAR

## 2022-06-30 MED ORDER — EPINEPHRINE 0.3 MG/0.3ML IJ SOAJ: 0.3000 mg | INTRAMUSCULAR | 1 refills | Status: AC | PRN

## 2022-06-30 MED ORDER — PREDNISONE 20 MG PO TABS
40.0000 mg | ORAL_TABLET | Freq: Every day | ORAL | 0 refills | Status: AC
Start: 2022-06-30 — End: 2022-07-03

## 2022-06-30 MED ORDER — DEXAMETHASONE SODIUM PHOSPHATE 10 MG/ML IJ SOLN
10.0000 mg | Freq: Once | INTRAMUSCULAR | Status: AC
  Administered 2022-06-30: 10 mg via INTRAMUSCULAR

## 2022-06-30 MED ORDER — EPINEPHRINE PF 1 MG/ML IJ SOLN: 0.3000 mg | Freq: Once | INTRAMUSCULAR | Status: DC

## 2022-06-30 NOTE — ED Provider Notes (Signed)
[facility] Provider Note    Event Date/Time   First MD Initiated Contact with Patient 06/30/22 2053     (approximate)   History   Allergic Reaction   HPI  Natalie Walters is a 36 y.o. female who presents to the emergency department from urgent care today because of concerns for allergic reaction.  Patient was sitting outside when she brushed in and off of her foot.  Shortly thereafter she started developing full body hives.  She did try taking Benadryl at home without any significant relief.  At urgent care she was given Decadron as well as epi.  At the time my exam she is feeling better.  She denies ever having any throat involvement or difficulty with breathing.  No nausea or vomiting.  Denies history of bad allergic reactions.   Physical Exam   Triage Vital Signs: ED Triage Vitals  Enc Vitals Group     BP 06/30/22 2027 (!) 151/86     Pulse Rate 06/30/22 2027 82     Resp 06/30/22 2027 19     Temp 06/30/22 2027 98.9 F (37.2 C)     Temp Source 06/30/22 2027 Oral     SpO2 06/30/22 2027 100 %     Weight --      Height --      Head Circumference --      Peak Flow --      Pain Score 06/30/22 2028 0     Pain Loc --      Pain Edu? --      Excl. in GC? --     Most recent vital signs: Vitals:   06/30/22 2027  BP: (!) 151/86  Pulse: 82  Resp: 19  Temp: 98.9 F (37.2 C)  SpO2: 100%    General: Awake, alert, oriented. CV:  Good peripheral perfusion. Regular rate and rhythm. Resp:  Normal effort. Lungs clear. Abd:  No distention.    ED Results / Procedures / Treatments   Labs (all labs ordered are listed, but only abnormal results are displayed) Labs Reviewed - No data to display   EKG  I, Phineas Semen, attending physician, personally viewed and interpreted this EKG  EKG Time: 2028 Rate: 73 Rhythm: sinus rhythm Axis: normal Intervals: qtc 430 QRS: narrow ST changes: no st elevation Impression: normal  ekg   RADIOLOGY None   PROCEDURES:  Critical Care performed: No  Procedures   MEDICATIONS ORDERED IN ED: Medications - No data to display   IMPRESSION / MDM / ASSESSMENT AND PLAN / ED COURSE  I reviewed the triage vital signs and the nursing notes.                              Differential diagnosis includes, but is not limited to, allergic reaction, anaphylaxis.   Patient's presentation is most consistent with acute presentation with potential threat to life or bodily function.  Patient presented to the emergency department today from urgent care because of concerns for possible anaphylaxis and having received EpiPen at urgent care.  The time my exam patient still has some hives but states she is improving.  No respiratory distress.  Discussed with patient that we will continue to observe patient for any recurrence of allergic reactions.   FINAL CLINICAL IMPRESSION(S) / ED DIAGNOSES   Final diagnoses:  Allergic reaction, initial encounter     Note:  This document was prepared using  [employer] and may include unintentional dictation errors.    Phineas Semen, MD 06/30/22 [phone]

## 2022-06-30 NOTE — ED Triage Notes (Signed)
Pt presents to ER via ems from [facility] after an allergic rxn.  Pt states she was bit by an ant on the top of her right foot around 1730 today and immediately broke out inn hives all over.  Pt states she took 75 mg benadryl at home and went to [facility] where she was given 0.3 IM epi, and 10 mg IM decadron.  Pt is A&O x4 at this time, and has hives noted on abdomen, upper legs and arms.

## 2022-06-30 NOTE — ED Provider Notes (Signed)
[facility]    CSN: [id number] Arrival date & time: 06/30/22  1921      History   Chief Complaint Chief Complaint  Patient presents with   Urticaria    HPI Natalie Walters is a 36 y.o. female.   HPI  History provided by patient and her husband   Natalie Walters presents for itchy rash.  States that she was bitten by an ant while waiting to pick up her daughter.  She was talking to a friend and then noticed that she was itching really bad and needed to leave.  Rash began about 5:30 PM and is spread through her whole body prior to arrival.  She denies headache, nausea, vomiting, abdominal pain, chest pain, shortness of breath.  She was previously bitten by ants and only had a local swelling.  She is unsure what kind of it bit her today.  Husband gave her 75 mg of Benadryl prior to arrival.  Husband said her heart rate got into the 120s at home.  She has not had anything like this before.      Past Medical History:  Diagnosis Date   Family history of breast cancer    Family history of colon cancer    Family history of ovarian cancer    Pelvic pain in female     There are no problems to display for this patient.   Past Surgical History:  Procedure Laterality Date   CESAREAN SECTION  2011   CHOLECYSTECTOMY     GALLBLADDER SURGERY  2006    OB History     Gravida  1   Para  1   Term  1   Preterm  0   AB  0   Living  2      SAB  0   IAB  0   Ectopic  0   Multiple  1   Live Births  2            Home Medications    Prior to Admission medications   Medication Sig Start Date End Date Taking? Authorizing Provider  EPINEPHrine 0.3 mg/0.3 mL IJ SOAJ injection Inject 0.3 mg into the muscle as needed for anaphylaxis. 06/30/22  Yes Brayleigh Rybacki, Ronnette Juniper, DO  JUNEL FE 1/20 1-20 MG-MCG tablet  09/22/18  Yes [provider]  azithromycin (ZITHROMAX) 250 MG tablet Take 2 tabs PO x 1 dose, then 1 tab PO QD x 4 days 10/04/21   Scot Jun, FNP  cephALEXin (KEFLEX) 500 MG capsule Take 1 capsule (500 mg total) by mouth 2 (two) times daily. 07/28/21   Sharion Balloon, FNP  lidocaine (XYLOCAINE) 2 % solution Use as directed 15 mLs in the mouth or throat every 3 (three) hours as needed for mouth pain (mix with warm water  gargle and spit). 10/04/21   Scot Jun, FNP    Family History Family History  Problem Relation Age of Onset   Cancer Mother        breast   Cancer Sister        breast   Cancer Maternal Grandmother        ovarian, breast   Heart disease Paternal Grandfather    Healthy Father     Social History Social History   Tobacco Use   Smoking status: Never   Smokeless tobacco: Never  Vaping Use   Vaping Use: Never used  Substance Use Topics   Alcohol use: Yes    Comment:  occas   Drug use: No     Allergies   Sulfa antibiotics and Tetracyclines & related   Review of Systems Review of Systems : limited by acuity of situation, see HPI     Physical Exam Triage Vital Signs ED Triage Vitals [06/30/22 1926]  Enc Vitals Group     BP      Pulse      Resp      Temp      Temp src      SpO2      Weight 200 lb (90.7 kg)     Height 5\' 10"  (1.778 m)     Head Circumference      Peak Flow      Pain Score 0     Pain Loc      Pain Edu?      Excl. in GC?    No data found.  Updated Vital Signs BP (!) 143/83 (BP Location: Right Arm)   Pulse 90   Temp 98.9 F (37.2 C)   Ht 5\' 10"  (1.778 m)   Wt 90.7 kg   SpO2 94%   BMI 28.70 kg/m   Visual Acuity Right Eye Distance:   Left Eye Distance:   Bilateral Distance:    Right Eye Near:   Left Eye Near:    Bilateral Near:     Physical Exam  GEN: alert, well appearing female, in no acute distress  EYES: extra occular movements intact, no scleral injection CV: regular rate and rhythm RESP: no increased work of breathing, clear to ascultation bilaterally MSK: no extremity edema, range of motion NEURO: alert, oriented x4, moves  all extremities appropriately PSYCH: Anxious, appropriate speech and behavior  SKIN: warm and dry; diffuse erythematous patches from her extremities to her face, cyanosis of the digits of her hands and toes   UC Treatments / Results  Labs (all labs ordered are listed, but only abnormal results are displayed) Labs Reviewed - No data to display  EKG   Radiology No results found.  Procedures Procedures (including critical care time)  Medications Ordered in UC Medications  dexamethasone (DECADRON) injection 10 mg (10 mg Intramuscular Given 06/30/22 1928)  EPINEPHrine (EPI-PEN) injection 0.3 mg (0.3 mg Intramuscular Given 06/30/22 1943)    Initial Impression / Assessment and Plan / UC Course  I have reviewed the triage vital signs and the nursing notes.  Pertinent labs & imaging results that were available during my care of the patient were reviewed by me and considered in my medical decision making (see chart for details).     Patient is a 36 y.o. femalewho presents for hives after being bit by an ant.  On exam she has cyanosis of the digits of her bilateral hands and feet.  She is intermittently tachycardic to the 120s.  Initial oxygenation saturation is 94% but rebounded to 99% while in the exam room.  Given 10 mg of Decadron IM and 0.3 mg of epinephrine IM.  EMS called for concern of anaphylaxis.  Patient to be transferred to the hospital.  Spoke with [facility] charge RN regarding patient's concern for anaphylaxis. RN stated she will expect patient.   EPI pen prescribed prior to discharge.    Final Clinical Impressions(s) / UC Diagnoses   Final diagnoses:  Anaphylaxis, initial encounter     Discharge Instructions      You are being transferred to the emergency department for concern of anaphylaxis due to your recent antibiotic.  You were given  10 mg of Decadron and 0.3 mg of epinephrine in your muscles prior to your discharge.  Be sure to get an EpiPen.  I sent one to  your pharmacy.      ED Prescriptions     Medication Sig Dispense Auth. Provider   EPINEPHrine 0.3 mg/0.3 mL IJ SOAJ injection Inject 0.3 mg into the muscle as needed for anaphylaxis. 2 each Katha Cabal, DO      PDMP not reviewed this encounter.              Katha Cabal, DO 06/30/22 1956

## 2022-06-30 NOTE — ED Notes (Addendum)
Patient is being discharged from the Urgent Care and sent to the Emergency Department via EMS . Per Katha Cabal MD, patient is in need of higher level of care due to allergic reaction, cyanosis of fingers and toes. Patient is aware and verbalizes understanding of plan of care.  Vitals:   06/30/22 1927  BP: (!) 143/83  Pulse: 90  Temp: 98.9 F (37.2 C)  SpO2: 94%

## 2022-06-30 NOTE — Discharge Instructions (Signed)
Please follow up with your primary care doctor. Please return for any throat swelling, difficulty breathing, worsening hives or any other new or concerning symptoms.

## 2022-06-30 NOTE — ED Triage Notes (Addendum)
Pt states she was at open house at school, pt states she was bit by an ant top of RT foot 5:30pm pt broke out in hives all over body. Pt states she has taken benadryl prior to coming in

## 2022-06-30 NOTE — Discharge Instructions (Addendum)
You are being transferred to the emergency department for concern of anaphylaxis due to your recent antibiotic.  You were given 10 mg of Decadron and 0.3 mg of epinephrine in your muscles prior to your discharge.  Be sure to get an EpiPen.  I sent one to your pharmacy.

## 2023-10-12 ENCOUNTER — Telehealth: Payer: 59
